# Patient Record
Sex: Female | Born: 1960 | Race: White | Hispanic: No | Smoking: Never smoker
Health system: Southern US, Community
[De-identification: ages and names within clinical notes are randomized; demographics above are authoritative.]

## PROBLEM LIST (undated history)

## (undated) DIAGNOSIS — C801 Malignant (primary) neoplasm, unspecified: Secondary | ICD-10-CM

## (undated) DIAGNOSIS — R251 Tremor, unspecified: Secondary | ICD-10-CM

## (undated) HISTORY — PX: TUBAL LIGATION: SHX77

## (undated) HISTORY — PX: GASTRECTOMY: SHX58

---

## 1997-12-29 ENCOUNTER — Emergency Department (HOSPITAL_COMMUNITY): Admission: EM | Admit: 1997-12-29 | Discharge: 1997-12-29 | Payer: Self-pay

## 1998-02-14 ENCOUNTER — Emergency Department (HOSPITAL_COMMUNITY): Admission: EM | Admit: 1998-02-14 | Discharge: 1998-02-14 | Payer: Self-pay | Admitting: *Deleted

## 2016-01-30 ENCOUNTER — Telehealth: Payer: Self-pay | Admitting: Hematology

## 2016-01-30 ENCOUNTER — Encounter: Payer: Self-pay | Admitting: Hematology

## 2016-01-30 NOTE — Progress Notes (Signed)
Called uninsured new patient to introduce myself as Estate manager/land agent and to discuss uninsured discount and medicaid application as well as [place] FAA. Left my return name and number.

## 2016-01-30 NOTE — Telephone Encounter (Signed)
Pt let me know that she will be self-pay. I will forward this information to our financial advocates.

## 2016-01-30 NOTE — Telephone Encounter (Signed)
Tc to the pt's daughter to reschedule appt for her mother. Unable to make 9/6 appt. Rescheduled to 9/8 with Dr. Burr Medico. Daughter voiced understanding. Location and address given. Demographics verified.

## 2016-01-30 NOTE — Progress Notes (Signed)
Patient returned my call. Introduced myself as Estate manager/land agent. Advised patient for being uninsured she would automatically receive a 55% discount and that she may apply for Medicaid. Also advised she may apply for the [facility] FAA for possibly an additional discount after she applies for Medicaid. Patient states she had Medicaid in [facility] and was told she would receive for [place] once she applies. Asked patient if she would like for me to mail the application to her [place] address. Patient states that would be great and that she could complete it and turn in next week when she comes to [facility]. Verified address on file and placed in outer office mail along with my card with my contact name and number. Patient verbalized understanding.

## 2016-02-04 ENCOUNTER — Telehealth: Payer: Self-pay | Admitting: Nurse Practitioner

## 2016-02-04 NOTE — Telephone Encounter (Signed)
Notified the patient's daughter about the change of appt. Appt has been moved to 9/8 at 1015 per Dr. Burr Medico for the pt to see Ned Card. Pt's daughter voiced understanding.

## 2016-02-05 ENCOUNTER — Ambulatory Visit: Payer: Self-pay | Admitting: Nurse Practitioner

## 2016-02-05 ENCOUNTER — Inpatient Hospital Stay (HOSPITAL_COMMUNITY)
Admission: EM | Admit: 2016-02-05 | Discharge: 2016-02-16 | DRG: 374 | Disposition: A | Discharge status: Hospice | Payer: Medicaid - Out of State | Attending: Internal Medicine | Admitting: Internal Medicine

## 2016-02-05 ENCOUNTER — Encounter (HOSPITAL_COMMUNITY): Payer: Self-pay | Admitting: Emergency Medicine

## 2016-02-05 ENCOUNTER — Emergency Department (HOSPITAL_COMMUNITY)
Admission: EM | Admit: 2016-02-05 | Discharge: 2016-02-05 | Disposition: A | Discharge status: Other Acute Inpatient Hospital | Payer: Self-pay

## 2016-02-05 DIAGNOSIS — K5900 Constipation, unspecified: Secondary | ICD-10-CM

## 2016-02-05 DIAGNOSIS — C169 Malignant neoplasm of stomach, unspecified: Secondary | ICD-10-CM | POA: Diagnosis present

## 2016-02-05 DIAGNOSIS — Z66 Do not resuscitate: Secondary | ICD-10-CM

## 2016-02-05 DIAGNOSIS — R188 Other ascites: Secondary | ICD-10-CM

## 2016-02-05 DIAGNOSIS — G893 Neoplasm related pain (acute) (chronic): Secondary | ICD-10-CM

## 2016-02-05 DIAGNOSIS — Z8249 Family history of ischemic heart disease and other diseases of the circulatory system: Secondary | ICD-10-CM

## 2016-02-05 DIAGNOSIS — D630 Anemia in neoplastic disease: Secondary | ICD-10-CM | POA: Diagnosis present

## 2016-02-05 DIAGNOSIS — R4586 Emotional lability: Secondary | ICD-10-CM | POA: Diagnosis not present

## 2016-02-05 DIAGNOSIS — Z9221 Personal history of antineoplastic chemotherapy: Secondary | ICD-10-CM

## 2016-02-05 DIAGNOSIS — Z515 Encounter for palliative care: Secondary | ICD-10-CM

## 2016-02-05 DIAGNOSIS — E43 Unspecified severe protein-calorie malnutrition: Secondary | ICD-10-CM | POA: Diagnosis present

## 2016-02-05 DIAGNOSIS — C786 Secondary malignant neoplasm of retroperitoneum and peritoneum: Principal | ICD-10-CM | POA: Diagnosis present

## 2016-02-05 DIAGNOSIS — R109 Unspecified abdominal pain: Secondary | ICD-10-CM | POA: Diagnosis present

## 2016-02-05 DIAGNOSIS — R64 Cachexia: Secondary | ICD-10-CM | POA: Diagnosis present

## 2016-02-05 DIAGNOSIS — Z881 Allergy status to other antibiotic agents status: Secondary | ICD-10-CM

## 2016-02-05 DIAGNOSIS — Z681 Body mass index (BMI) 19 or less, adult: Secondary | ICD-10-CM

## 2016-02-05 DIAGNOSIS — N390 Urinary tract infection, site not specified: Secondary | ICD-10-CM | POA: Diagnosis present

## 2016-02-05 DIAGNOSIS — Z833 Family history of diabetes mellitus: Secondary | ICD-10-CM

## 2016-02-05 DIAGNOSIS — Z801 Family history of malignant neoplasm of trachea, bronchus and lung: Secondary | ICD-10-CM

## 2016-02-05 DIAGNOSIS — Z7189 Other specified counseling: Secondary | ICD-10-CM

## 2016-02-05 DIAGNOSIS — K651 Peritoneal abscess: Secondary | ICD-10-CM | POA: Diagnosis present

## 2016-02-05 DIAGNOSIS — C799 Secondary malignant neoplasm of unspecified site: Secondary | ICD-10-CM

## 2016-02-05 DIAGNOSIS — R180 Malignant ascites: Secondary | ICD-10-CM

## 2016-02-05 DIAGNOSIS — D649 Anemia, unspecified: Secondary | ICD-10-CM | POA: Diagnosis present

## 2016-02-05 DIAGNOSIS — C162 Malignant neoplasm of body of stomach: Secondary | ICD-10-CM | POA: Diagnosis present

## 2016-02-05 DIAGNOSIS — Z903 Acquired absence of stomach [part of]: Secondary | ICD-10-CM

## 2016-02-05 DIAGNOSIS — E876 Hypokalemia: Secondary | ICD-10-CM | POA: Diagnosis not present

## 2016-02-05 DIAGNOSIS — C787 Secondary malignant neoplasm of liver and intrahepatic bile duct: Secondary | ICD-10-CM | POA: Diagnosis present

## 2016-02-05 DIAGNOSIS — R1114 Bilious vomiting: Secondary | ICD-10-CM

## 2016-02-05 DIAGNOSIS — Z79899 Other long term (current) drug therapy: Secondary | ICD-10-CM

## 2016-02-05 HISTORY — DX: Malignant (primary) neoplasm, unspecified: C80.1

## 2016-02-05 HISTORY — DX: Tremor, unspecified: R25.1

## 2016-02-05 NOTE — ED Notes (Signed)
MD at bedside. 

## 2016-02-05 NOTE — ED Triage Notes (Signed)
Pt family reports pt was discharged from [facility] in [facility] for abd pain that has been ongoing for the last 3 weeks. Pt is a stomach cancer pt not under any treatment at this time. Pt states pain in abd has been worse since having drain from stomach removed 3 weeks ago. Pt has been having vomiting for the last week and unable to have bowel movement.

## 2016-02-05 NOTE — ED Triage Notes (Signed)
Pt supposed to be at [facility] not here  Going there now

## 2016-02-05 NOTE — ED Provider Notes (Addendum)
[facility] DEPT Provider Note   CSN: [id number] Arrival date & time: 02/05/16  2342  By signing my name below, I, [name], attest that this documentation has been prepared under the direction and in the presence of [name], MD . Electronically Signed: [name], Scribe. 02/06/2016. 12:07 AM.  History   Chief Complaint Chief Complaint  Patient presents with  . Abdominal Pain   The history is provided by the patient and a relative.  Abdominal Pain   This is a chronic problem. The current episode started more than 1 week ago. The problem has been gradually worsening. The pain is located in the generalized abdominal region. Associated symptoms include constipation and arthralgias. Pertinent negatives include fever and dysuria. Nothing aggravates the symptoms. Nothing relieves the symptoms. Past workup includes CT scan. Her past medical history does not include ulcerative colitis or Crohn's disease.    HPI Comments: Selena Hayes is a 55 y.o. female who presents to the Emergency Department complaining of gradually worsening, constant, abdominal pain due to stage 4 stomach cancer that worsened last night. Pt reports she was diagnosed with stage 4 stomach cancer metastasizing to her pancreas in February of this year and has undergone 9 weeks of chemotherapy with no relief. Pt also reports partial gastrectomy on 12/17/15. Per daughter, pt currently lives in [place], [place] and was taken to [facility] last night for similar symptoms of abdominal pain. She notes the doctors at this facility wanted to admit her for "3 different bacterias in her stomach that were causing a possible infection" and wanted to obtain fluid to see if cancer cells were present. She states the only way she was allowed to leave that facility was if she promised to bring pt here when she arrived in [place]. Pt reports she has not had any bowel movements for over a month even after taking OTC  laxatives.   No past medical history on file.  There are no active problems to display for this patient.  No past surgical history on file.  OB History    No data available       Home Medications    Prior to Admission medications   Not on File    Family History No family history on file.  Social History Social History  Substance Use Topics  . Smoking status: Not on file  . Smokeless tobacco: Not on file  . Alcohol use Not on file   Allergies   Review of patient's allergies indicates not on file.   Review of Systems Review of Systems  Constitutional: Negative for fever.  Respiratory: Negative for shortness of breath.   Cardiovascular: Negative for chest pain.  Gastrointestinal: Positive for abdominal pain and constipation.  Genitourinary: Negative for dysuria.  Musculoskeletal: Positive for arthralgias.  All other systems reviewed and are negative.  Physical Exam Updated Vital Signs BP (!) 171/106 (BP Location: Right Arm)   Pulse 110   Temp 98.2 F (36.8 C) (Oral)   Resp (!) 27   Ht 5\' 2"  (1.575 m)   Wt 92 lb (41.7 kg)   SpO2 97%   BMI 16.83 kg/m   Physical Exam  Constitutional: She appears well-developed and well-nourished.  HENT:  Head: Normocephalic.  Mouth/Throat: Oropharynx is clear and moist. No oropharyngeal exudate.  Eyes: Conjunctivae and EOM are normal. Pupils are equal, round, and reactive to light. Right eye exhibits no discharge. Left eye exhibits no discharge. No scleral icterus.  Neck: Normal range of motion.  Neck supple. No JVD present. No tracheal deviation present.  Trachea is midline. No stridor or carotid bruits.  No cervical or supraclavicular lymphadenopathy   Cardiovascular: Normal rate, regular rhythm, normal heart sounds and intact distal pulses.   No murmur heard. Pulmonary/Chest: Effort normal and breath sounds normal. No stridor. No respiratory distress. She has no wheezes. She has no rales.  Lungs CTA bilaterally.    Abdominal: Soft. She exhibits distension. She exhibits no mass. There is no tenderness. There is no rebound and no guarding.  Midline incision clear dry and intact no bruising  Distended   Musculoskeletal: Normal range of motion. She exhibits no edema or tenderness.  Intact dorsalis pedis pulse   Lymphadenopathy:    She has no cervical adenopathy.  Neurological: She is alert. She has normal reflexes. She displays normal reflexes. She exhibits normal muscle tone.  Skin: Skin is warm and dry. Capillary refill takes less than 2 seconds.  Psychiatric: She has a normal mood and affect. Her behavior is normal.  Nursing note and vitals reviewed.  ED Treatments / Results  Labs (all labs ordered are listed, but only abnormal results are displayed) Labs Reviewed - No data to display  EKG  EKG Interpretation  Date/Time:  Wednesday February 05 2016 23:42:42 EDT Ventricular Rate:  116 PR Interval:    QRS Duration: 82 QT Interval:  317 QTC Calculation: 441 R Axis:   74 Text Interpretation:  Sinus tachycardia Baseline wander in lead(s) V4 Confirmed by [facility]  MD, Zyrus Hetland ([id number]) on 02/05/2016 11:45:32 PM       Radiology Dg Abd Acute W/chest  Result Date: 02/06/2016 CLINICAL DATA:  55 year old female with history of stage IV stomach cancer and partial gastrectomy presenting with severe mid and lower abdominal pain. EXAM: DG ABDOMEN ACUTE W/ 1V CHEST COMPARISON:  None. FINDINGS: There is minimal left lung base atelectatic changes. A 12 mm rounded radiopaque nodular density in the right lower lung field laterally in the seventh intercostal space likely corresponds to the nipple shadow. Repeat radiograph with nipple markers may provide better evaluation. The lungs are otherwise clear. There is no pleural effusion or pneumothorax. The cardiac silhouette is within normal limits. Right pectoral Port-A-Cath with tip at the cavoatrial junction. No acute osseous pathology. There is postsurgical  changes of the bowel with multiple surgical sutures in the left hemi abdomen. Large amount of dense stool noted throughout the colon. There is no bowel dilatation or evidence of obstruction. No free air or radiopaque calculi noted. The soft tissues and the osseous structures are grossly unremarkable. IMPRESSION: Constipation.  No bowel obstruction or free air. A Next 12 mm right lung base nodular density likely corresponds to the nipple shadow. Right-sided Port-A-Cath with tip at the cavoatrial junction. Electronically Signed   By: Anner Crete M.D.   On: 02/06/2016 01:15   Procedures Procedures  DIAGNOSTIC STUDIES:  Oxygen Saturation is 97% on RA, normal by my interpretation.    COORDINATION OF CARE:  11:58 PM Discussed treatment plan with pt at bedside and pt agreed to plan.  Medications Ordered in ED Medications - No data to display   EKG Interpretation  Date/Time:  Wednesday February 05 2016 23:42:42 EDT Ventricular Rate:  116 PR Interval:    QRS Duration: 82 QT Interval:  317 QTC Calculation: 441 R Axis:   74 Text Interpretation:  Sinus tachycardia Baseline wander in lead(s) V4 Confirmed by [facility]  MD, Nakai Yard ([id number]) on 02/05/2016 11:45:32 PM  Initial Impression / Assessment and Plan / ED Course  I have reviewed the triage vital signs and the nursing notes.  Pertinent labs & imaging results that were available during my care of the patient were reviewed by me and considered in my medical decision making (see chart for details). . Vitals:   02/06/16 0230 02/06/16 0300  BP: 129/83 131/91  Pulse: 99 102  Resp: (!) 28 17  Temp:     Results for orders placed or performed during the hospital encounter of 02/05/16  CBC with Differential/Platelet  Result Value Ref Range   WBC 9.1 4.0 - 10.5 K/uL   RBC 2.58 (L) 3.87 - 5.11 MIL/uL   Hemoglobin 7.7 (L) 12.0 - 15.0 g/dL   HCT 23.0 (L) 36.0 - 46.0 %   MCV 89.1 78.0 - 100.0 fL   MCH 29.8 26.0 - 34.0 pg   MCHC  33.5 30.0 - 36.0 g/dL   RDW 14.0 11.5 - 15.5 %   Platelets 416 (H) 150 - 400 K/uL   Neutrophils Relative % 73 %   Neutro Abs 6.7 1.7 - 7.7 K/uL   Lymphocytes Relative 15 %   Lymphs Abs 1.4 0.7 - 4.0 K/uL   Monocytes Relative 11 %   Monocytes Absolute 1.0 0.1 - 1.0 K/uL   Eosinophils Relative 1 %   Eosinophils Absolute 0.1 0.0 - 0.7 K/uL   Basophils Relative 0 %   Basophils Absolute 0.0 0.0 - 0.1 K/uL  Comprehensive metabolic panel  Result Value Ref Range   Sodium 133 (L) 135 - 145 mmol/L   Potassium 3.5 3.5 - 5.1 mmol/L   Chloride 97 (L) 101 - 111 mmol/L   CO2 25 22 - 32 mmol/L   Glucose, Bld 105 (H) 65 - 99 mg/dL   BUN 12 6 - 20 mg/dL   Creatinine, Ser 0.58 0.44 - 1.00 mg/dL   Calcium 8.8 (L) 8.9 - 10.3 mg/dL   Total Protein 6.6 6.5 - 8.1 g/dL   Albumin 2.7 (L) 3.5 - 5.0 g/dL   AST 15 15 - 41 U/L   ALT 17 14 - 54 U/L   Alkaline Phosphatase 75 38 - 126 U/L   Total Bilirubin 1.0 0.3 - 1.2 mg/dL   GFR calc non Af Amer >60 >60 mL/min   GFR calc Af Amer >60 >60 mL/min   Anion gap 11 5 - 15  Urinalysis, Routine w reflex microscopic (not at [facility])  Result Value Ref Range   Color, Urine YELLOW YELLOW   APPearance CLOUDY (A) CLEAR   Specific Gravity, Urine 1.011 1.005 - 1.030   pH 6.0 5.0 - 8.0   Glucose, UA NEGATIVE NEGATIVE mg/dL   Hgb urine dipstick TRACE (A) NEGATIVE   Bilirubin Urine NEGATIVE NEGATIVE   Ketones, ur 40 (A) NEGATIVE mg/dL   Protein, ur NEGATIVE NEGATIVE mg/dL   Nitrite NEGATIVE NEGATIVE   Leukocytes, UA LARGE (A) NEGATIVE  Lipase, blood  Result Value Ref Range   Lipase 15 11 - 51 U/L  Urine microscopic-add on  Result Value Ref Range   Squamous Epithelial / LPF 6-30 (A) NONE SEEN   WBC, UA TOO NUMEROUS TO COUNT 0 - 5 WBC/hpf   RBC / HPF 0-5 0 - 5 RBC/hpf   Bacteria, UA FEW (A) NONE SEEN   Trichomonas, UA PRESENT   I-Stat Chem 8, ED  Result Value Ref Range   Sodium 132 (L) 135 - 145 mmol/L   Potassium 3.5 3.5 - 5.1 mmol/L  Chloride 95 (L) 101 - 111  mmol/L   BUN 10 6 - 20 mg/dL   Creatinine, Ser 0.50 0.44 - 1.00 mg/dL   Glucose, Bld 101 (H) 65 - 99 mg/dL   Calcium, Ion 1.15 1.15 - 1.40 mmol/L   TCO2 24 0 - 100 mmol/L   Hemoglobin 11.9 (L) 12.0 - 15.0 g/dL   HCT 35.0 (L) 36.0 - 46.0 %  I-stat troponin, ED  Result Value Ref Range   Troponin i, poc 0.00 0.00 - 0.08 ng/mL   Comment 3          POC occult blood, ED RN will collect  Result Value Ref Range   Fecal Occult Bld NEGATIVE NEGATIVE  Type and screen [place]  Result Value Ref Range   ABO/RH(D) O POS    Antibody Screen NEG    Sample Expiration 02/09/2016    Dg Abd Acute W/chest  Result Date: 02/06/2016 CLINICAL DATA:  55 year old female with history of stage IV stomach cancer and partial gastrectomy presenting with severe mid and lower abdominal pain. EXAM: DG ABDOMEN ACUTE W/ 1V CHEST COMPARISON:  None. FINDINGS: There is minimal left lung base atelectatic changes. A 12 mm rounded radiopaque nodular density in the right lower lung field laterally in the seventh intercostal space likely corresponds to the nipple shadow. Repeat radiograph with nipple markers may provide better evaluation. The lungs are otherwise clear. There is no pleural effusion or pneumothorax. The cardiac silhouette is within normal limits. Right pectoral Port-A-Cath with tip at the cavoatrial junction. No acute osseous pathology. There is postsurgical changes of the bowel with multiple surgical sutures in the left hemi abdomen. Large amount of dense stool noted throughout the colon. There is no bowel dilatation or evidence of obstruction. No free air or radiopaque calculi noted. The soft tissues and the osseous structures are grossly unremarkable. IMPRESSION: Constipation.  No bowel obstruction or free air. A Next 12 mm right lung base nodular density likely corresponds to the nipple shadow. Right-sided Port-A-Cath with tip at the cavoatrial junction. Electronically Signed   By: Anner Crete  M.D.   On: 02/06/2016 01:15   Medications  HYDROmorphone (DILAUDID) injection 1 mg (not administered)  HYDROmorphone (DILAUDID) injection 1 mg (1 mg Intravenous Given 02/06/16 0034)  ondansetron (ZOFRAN) injection 4 mg (4 mg Intravenous Given 02/06/16 0034)  sodium chloride 0.9 % bolus 500 mL (0 mLs Intravenous Stopped 02/06/16 0130)  HYDROmorphone (DILAUDID) injection 1 mg (1 mg Intravenous Given 02/06/16 0324)     I personally performed the services described in this documentation, which was scribed in my presence. The recorded information has been reviewed and is accurate.   Final Clinical Impressions(s) / ED Diagnoses   Final diagnoses:  None    New Prescriptions New Prescriptions   No medications on file     Marquelle Musgrave, MD 02/06/16 0359    Kimblery Diop, MD 02/06/16 [phone]

## 2016-02-05 NOTE — ED Notes (Signed)
Bed: RESB Expected date:  Expected time:  Means of arrival:  Comments: abd cancer from car

## 2016-02-06 ENCOUNTER — Emergency Department (HOSPITAL_COMMUNITY): Payer: Medicaid - Out of State

## 2016-02-06 ENCOUNTER — Inpatient Hospital Stay (HOSPITAL_COMMUNITY): Payer: Medicaid - Out of State

## 2016-02-06 ENCOUNTER — Encounter (HOSPITAL_COMMUNITY): Payer: Self-pay | Admitting: Emergency Medicine

## 2016-02-06 DIAGNOSIS — R630 Anorexia: Secondary | ICD-10-CM

## 2016-02-06 DIAGNOSIS — Z833 Family history of diabetes mellitus: Secondary | ICD-10-CM | POA: Diagnosis not present

## 2016-02-06 DIAGNOSIS — Z8249 Family history of ischemic heart disease and other diseases of the circulatory system: Secondary | ICD-10-CM | POA: Diagnosis not present

## 2016-02-06 DIAGNOSIS — Z681 Body mass index (BMI) 19 or less, adult: Secondary | ICD-10-CM | POA: Diagnosis not present

## 2016-02-06 DIAGNOSIS — R180 Malignant ascites: Secondary | ICD-10-CM | POA: Diagnosis present

## 2016-02-06 DIAGNOSIS — Z801 Family history of malignant neoplasm of trachea, bronchus and lung: Secondary | ICD-10-CM | POA: Diagnosis not present

## 2016-02-06 DIAGNOSIS — Z515 Encounter for palliative care: Secondary | ICD-10-CM | POA: Diagnosis not present

## 2016-02-06 DIAGNOSIS — R4586 Emotional lability: Secondary | ICD-10-CM | POA: Diagnosis not present

## 2016-02-06 DIAGNOSIS — E43 Unspecified severe protein-calorie malnutrition: Secondary | ICD-10-CM | POA: Diagnosis present

## 2016-02-06 DIAGNOSIS — C162 Malignant neoplasm of body of stomach: Secondary | ICD-10-CM | POA: Diagnosis present

## 2016-02-06 DIAGNOSIS — R64 Cachexia: Secondary | ICD-10-CM | POA: Diagnosis present

## 2016-02-06 DIAGNOSIS — R634 Abnormal weight loss: Secondary | ICD-10-CM

## 2016-02-06 DIAGNOSIS — R1084 Generalized abdominal pain: Secondary | ICD-10-CM | POA: Diagnosis present

## 2016-02-06 DIAGNOSIS — R109 Unspecified abdominal pain: Secondary | ICD-10-CM

## 2016-02-06 DIAGNOSIS — D630 Anemia in neoplastic disease: Secondary | ICD-10-CM

## 2016-02-06 DIAGNOSIS — D649 Anemia, unspecified: Secondary | ICD-10-CM | POA: Diagnosis present

## 2016-02-06 DIAGNOSIS — Z9221 Personal history of antineoplastic chemotherapy: Secondary | ICD-10-CM | POA: Diagnosis not present

## 2016-02-06 DIAGNOSIS — N390 Urinary tract infection, site not specified: Secondary | ICD-10-CM | POA: Diagnosis present

## 2016-02-06 DIAGNOSIS — Z881 Allergy status to other antibiotic agents status: Secondary | ICD-10-CM | POA: Diagnosis not present

## 2016-02-06 DIAGNOSIS — C786 Secondary malignant neoplasm of retroperitoneum and peritoneum: Principal | ICD-10-CM

## 2016-02-06 DIAGNOSIS — K651 Peritoneal abscess: Secondary | ICD-10-CM | POA: Diagnosis present

## 2016-02-06 DIAGNOSIS — R110 Nausea: Secondary | ICD-10-CM

## 2016-02-06 DIAGNOSIS — R188 Other ascites: Secondary | ICD-10-CM

## 2016-02-06 DIAGNOSIS — E876 Hypokalemia: Secondary | ICD-10-CM | POA: Diagnosis not present

## 2016-02-06 DIAGNOSIS — G893 Neoplasm related pain (acute) (chronic): Secondary | ICD-10-CM

## 2016-02-06 DIAGNOSIS — Z903 Acquired absence of stomach [part of]: Secondary | ICD-10-CM | POA: Diagnosis not present

## 2016-02-06 DIAGNOSIS — K5900 Constipation, unspecified: Secondary | ICD-10-CM

## 2016-02-06 DIAGNOSIS — Z66 Do not resuscitate: Secondary | ICD-10-CM | POA: Diagnosis not present

## 2016-02-06 DIAGNOSIS — C787 Secondary malignant neoplasm of liver and intrahepatic bile duct: Secondary | ICD-10-CM | POA: Diagnosis present

## 2016-02-06 DIAGNOSIS — C169 Malignant neoplasm of stomach, unspecified: Secondary | ICD-10-CM

## 2016-02-06 DIAGNOSIS — Z79899 Other long term (current) drug therapy: Secondary | ICD-10-CM | POA: Diagnosis not present

## 2016-02-06 LAB — URINE MICROSCOPIC-ADD ON

## 2016-02-06 LAB — I-STAT CHEM 8, ED
BUN: 10 mg/dL (ref 6–20)
CHLORIDE: 95 mmol/L — AB (ref 101–111)
CREATININE: 0.5 mg/dL (ref 0.44–1.00)
Calcium, Ion: 1.15 mmol/L (ref 1.15–1.40)
GLUCOSE: 101 mg/dL — AB (ref 65–99)
HEMATOCRIT: 35 % — AB (ref 36.0–46.0)
HEMOGLOBIN: 11.9 g/dL — AB (ref 12.0–15.0)
POTASSIUM: 3.5 mmol/L (ref 3.5–5.1)
Sodium: 132 mmol/L — ABNORMAL LOW (ref 135–145)
TCO2: 24 mmol/L (ref 0–100)

## 2016-02-06 LAB — GLUCOSE, CAPILLARY
GLUCOSE-CAPILLARY: 106 mg/dL — AB (ref 65–99)
GLUCOSE-CAPILLARY: 115 mg/dL — AB (ref 65–99)

## 2016-02-06 LAB — TYPE AND SCREEN
ABO/RH(D): O POS
ANTIBODY SCREEN: NEGATIVE

## 2016-02-06 LAB — I-STAT TROPONIN, ED: Troponin i, poc: 0 ng/mL (ref 0.00–0.08)

## 2016-02-06 LAB — COMPREHENSIVE METABOLIC PANEL
ALBUMIN: 2.7 g/dL — AB (ref 3.5–5.0)
ALT: 17 U/L (ref 14–54)
ANION GAP: 11 (ref 5–15)
AST: 15 U/L (ref 15–41)
Alkaline Phosphatase: 75 U/L (ref 38–126)
BUN: 12 mg/dL (ref 6–20)
CHLORIDE: 97 mmol/L — AB (ref 101–111)
CO2: 25 mmol/L (ref 22–32)
Calcium: 8.8 mg/dL — ABNORMAL LOW (ref 8.9–10.3)
Creatinine, Ser: 0.58 mg/dL (ref 0.44–1.00)
GFR calc non Af Amer: >60 mL/min (ref >60)
GLUCOSE: 105 mg/dL — AB (ref 65–99)
POTASSIUM: 3.5 mmol/L (ref 3.5–5.1)
SODIUM: 133 mmol/L — AB (ref 135–145)
Total Bilirubin: 1 mg/dL (ref 0.3–1.2)
Total Protein: 6.6 g/dL (ref 6.5–8.1)

## 2016-02-06 LAB — CBC WITH DIFFERENTIAL/PLATELET
BASOS PCT: 0 %
Basophils Absolute: 0 10*3/uL (ref 0.0–0.1)
EOS ABS: 0.1 10*3/uL (ref 0.0–0.7)
EOS PCT: 1 %
HCT: 23 % — ABNORMAL LOW (ref 36.0–46.0)
HEMOGLOBIN: 7.7 g/dL — AB (ref 12.0–15.0)
LYMPHS ABS: 1.4 10*3/uL (ref 0.7–4.0)
Lymphocytes Relative: 15 %
MCH: 29.8 pg (ref 26.0–34.0)
MCHC: 33.5 g/dL (ref 30.0–36.0)
MCV: 89.1 fL (ref 78.0–100.0)
MONO ABS: 1 10*3/uL (ref 0.1–1.0)
MONOS PCT: 11 %
NEUTROS PCT: 73 %
Neutro Abs: 6.7 10*3/uL (ref 1.7–7.7)
PLATELETS: 416 10*3/uL — AB (ref 150–400)
RBC: 2.58 MIL/uL — ABNORMAL LOW (ref 3.87–5.11)
RDW: 14 % (ref 11.5–15.5)
WBC: 9.1 10*3/uL (ref 4.0–10.5)

## 2016-02-06 LAB — URINALYSIS, ROUTINE W REFLEX MICROSCOPIC
Bilirubin Urine: NEGATIVE
GLUCOSE, UA: NEGATIVE mg/dL
Ketones, ur: 40 mg/dL — AB
Nitrite: NEGATIVE
PROTEIN: NEGATIVE mg/dL
SPECIFIC GRAVITY, URINE: 1.011 (ref 1.005–1.030)
pH: 6 (ref 5.0–8.0)

## 2016-02-06 LAB — ABO/RH: ABO/RH(D): O POS

## 2016-02-06 LAB — LIPASE, BLOOD: LIPASE: 15 U/L (ref 11–51)

## 2016-02-06 LAB — POC OCCULT BLOOD, ED: Fecal Occult Bld: NEGATIVE

## 2016-02-06 MED ORDER — BISACODYL 10 MG RE SUPP: 10.0000 mg | Freq: Every day | RECTAL | Status: DC | PRN

## 2016-02-06 MED ORDER — METHYLNALTREXONE BROMIDE 12 MG/0.6ML ~~LOC~~ SOLN
8.0000 mg | Freq: Once | SUBCUTANEOUS | Status: AC
  Administered 2016-02-06: 8 mg via SUBCUTANEOUS
  Filled 2016-02-06: qty 0.6

## 2016-02-06 MED ORDER — FLEET ENEMA 7-19 GM/118ML RE ENEM
1.0000 | ENEMA | Freq: Once | RECTAL | Status: AC
  Administered 2016-02-06: 1 via RECTAL
  Filled 2016-02-06: qty 1

## 2016-02-06 MED ORDER — SODIUM CHLORIDE 0.9 % IV BOLUS (SEPSIS)
500.0000 mL | Freq: Once | INTRAVENOUS | Status: AC
  Administered 2016-02-06: 500 mL via INTRAVENOUS

## 2016-02-06 MED ORDER — FENTANYL CITRATE (PF) 100 MCG/2ML IJ SOLN
12.5000 ug | INTRAMUSCULAR | Status: DC | PRN
  Administered 2016-02-06: 12.5 ug via INTRAVENOUS
  Filled 2016-02-06: qty 2

## 2016-02-06 MED ORDER — IOPAMIDOL (ISOVUE-300) INJECTION 61%
100.0000 mL | Freq: Once | INTRAVENOUS | Status: AC | PRN
  Administered 2016-02-06: 80 mL via INTRAVENOUS

## 2016-02-06 MED ORDER — ONDANSETRON HCL 4 MG PO TABS: 4.0000 mg | ORAL_TABLET | Freq: Four times a day (QID) | ORAL | Status: DC | PRN

## 2016-02-06 MED ORDER — HYDROMORPHONE HCL 1 MG/ML IJ SOLN
1.0000 mg | Freq: Once | INTRAMUSCULAR | Status: AC
  Administered 2016-02-06: 1 mg via INTRAVENOUS
  Filled 2016-02-06: qty 1

## 2016-02-06 MED ORDER — DEXTROSE-NACL 5-0.9 % IV SOLN
INTRAVENOUS | Status: AC
  Administered 2016-02-06 (×2): via INTRAVENOUS

## 2016-02-06 MED ORDER — IOPAMIDOL (ISOVUE-300) INJECTION 61%: 30.0000 mL | Freq: Once | INTRAVENOUS | Status: DC | PRN

## 2016-02-06 MED ORDER — HYDROMORPHONE HCL 1 MG/ML IJ SOLN
0.5000 mg | Freq: Once | INTRAMUSCULAR | Status: AC
  Administered 2016-02-06: 0.5 mg via INTRAVENOUS
  Filled 2016-02-06: qty 1

## 2016-02-06 MED ORDER — ONDANSETRON HCL 4 MG/2ML IJ SOLN
4.0000 mg | Freq: Four times a day (QID) | INTRAMUSCULAR | Status: DC | PRN
  Administered 2016-02-06 – 2016-02-07 (×5): 4 mg via INTRAVENOUS
  Filled 2016-02-06 (×5): qty 2

## 2016-02-06 MED ORDER — ACETAMINOPHEN 325 MG PO TABS: 650.0000 mg | ORAL_TABLET | Freq: Four times a day (QID) | ORAL | Status: DC | PRN

## 2016-02-06 MED ORDER — SODIUM CHLORIDE 0.9% FLUSH: 10.0000 mL | INTRAVENOUS | Status: DC | PRN

## 2016-02-06 MED ORDER — ACETAMINOPHEN 650 MG RE SUPP: 650.0000 mg | Freq: Four times a day (QID) | RECTAL | Status: DC | PRN

## 2016-02-06 MED ORDER — ONDANSETRON HCL 4 MG/2ML IJ SOLN
4.0000 mg | Freq: Once | INTRAMUSCULAR | Status: AC
  Administered 2016-02-06: 4 mg via INTRAVENOUS
  Filled 2016-02-06: qty 2

## 2016-02-06 MED ORDER — HYDROMORPHONE HCL 2 MG/ML IJ SOLN
2.0000 mg | INTRAMUSCULAR | Status: DC | PRN
  Administered 2016-02-06 – 2016-02-11 (×46): 2 mg via INTRAVENOUS
  Filled 2016-02-06 (×31): qty 1

## 2016-02-06 MED ORDER — NICOTINE 14 MG/24HR TD PT24
14.0000 mg | MEDICATED_PATCH | Freq: Every day | TRANSDERMAL | Status: DC
  Administered 2016-02-08 – 2016-02-11 (×4): 14 mg via TRANSDERMAL
  Filled 2016-02-06 (×5): qty 1

## 2016-02-06 MED ORDER — HYDROMORPHONE HCL 1 MG/ML IJ SOLN
1.0000 mg | INTRAMUSCULAR | Status: DC | PRN
  Administered 2016-02-06 (×5): 1 mg via INTRAVENOUS
  Filled 2016-02-06 (×5): qty 1

## 2016-02-06 MED ORDER — HYDROMORPHONE HCL 1 MG/ML IJ SOLN
0.5000 mg | INTRAMUSCULAR | Status: DC | PRN
  Administered 2016-02-06: 0.5 mg via INTRAVENOUS
  Filled 2016-02-06: qty 1

## 2016-02-06 MED ORDER — LEVOFLOXACIN IN D5W 500 MG/100ML IV SOLN
500.0000 mg | INTRAVENOUS | Status: DC
  Administered 2016-02-06 – 2016-02-08 (×3): 500 mg via INTRAVENOUS
  Filled 2016-02-06 (×3): qty 100

## 2016-02-06 NOTE — Progress Notes (Signed)
Patient shaking uncontrollably after voiding in extreme pain in her abdomen, notified MD that pain was not controlled with 1 mg of Dilaudid q2 hours.  New orders received.  Urine culture added.

## 2016-02-06 NOTE — Progress Notes (Signed)
Bladder scan performed approximately 30 minutes after patient voided, approximately 120 ccs of urine detected by scanner.

## 2016-02-06 NOTE — Progress Notes (Signed)
Pharmacy Antibiotic Note  Selena Hayes is a 55 y.o. female admitted on 02/05/2016 with UTI.  Pharmacy has been consulted for levaquin dosing.  Plan: Levaquin 500mg  IV q24h  Height: 5\' 2"  (157.5 cm) Weight: 92 lb (41.7 kg) IBW/kg (Calculated) : 50.1  Temp (24hrs), Avg:98.4 F (36.9 C), Min:98.2 F (36.8 C), Max:98.5 F (36.9 C)   Recent Labs Lab 02/06/16 0006 02/06/16 0027  WBC 9.1  --   CREATININE 0.58 0.50    Estimated Creatinine Clearance: 51.7 mL/min (by C-G formula based on SCr of 0.8 mg/dL).    Allergies  Allergen Reactions  . Keflex [Cephalexin] Hives    Antimicrobials this admission: 9/7 levaquin >>    >>   Dose adjustments this admission:   Microbiology results:  BCx:   UCx:    Sputum:    MRSA PCR:   Thank you for allowing pharmacy to be a part of this patient's care.  Dorrene German 02/06/2016 4:26 AM

## 2016-02-06 NOTE — ED Notes (Signed)
Pt reporting increase in pain. 10/10

## 2016-02-06 NOTE — Progress Notes (Signed)
Patient with severe pain and nausea after taking 1-2 sips of Sprite.  MD notified, order changed back to NPO except for ice chips.

## 2016-02-06 NOTE — ED Notes (Signed)
Verbal order for Dilaudid 1mg  IVP per Dr.Polumbo

## 2016-02-06 NOTE — Progress Notes (Signed)
Patient admitted after midnight, please see H&P.  Dr. Burr Medico to see patient.  CT scan done.  Relistor ordered for constipation.  Patient requesting liquids.  Eulogio Bear DO

## 2016-02-06 NOTE — Consult Note (Addendum)
[place]  [phone]) [phone]   [place]  DOB: 1960/09/15  MR#: [id number]  CSN#: [id number]    Requesting Physician: [facility]  Patient Care Team: No Pcp Per Patient as PCP - General (General Practice)  Reason for consult: metastatic gastric cancer   Oncology History   Gastric adenocarcinoma [facility])   Staging form: Gastric Stromal Tumor - Gastric Gist, AJCC 7th Edition   - Clinical stage from 09/05/2015: T3, N0, M0 - Signed by Truitt Merle, MD on 02/06/2016   - Pathologic stage from 12/17/2015: Stage IV (T3, N1, M1) - Signed by Truitt Merle, MD on 02/06/2016      Gastric adenocarcinoma ([place])   08/20/2015 Initial Diagnosis    Gastric adenocarcinoma ([place])      08/20/2015 Initial Biopsy    Gastric ulcer biopsy showed invasive poorly differentiated adeno carcinoma      09/04/2015 Imaging    Staging PET CT scan showed mural thickening involving the gastric antrum, no significant hypermetabolic uptake. No evidence of lymph node or distant metastasis.      09/25/2015 -  Chemotherapy    Neoadjuvant chemotherapy with ECF (5-FU, epirubicin, cisplatin every 21 days for 3 cycles        12/17/2015 Surgery    Subtotal gastrectomy with Roux-en-Y gastro-jejunostomy, by Dr. Lavetta Nielsen      12/17/2015 Pathology Results    Partial gastrectomy showed adenocarcinoma in the gastric antrum, greater curvature, diffuse type predominantly by signet ring carcinoma, poorly differentiated, tumor invades the seroma, 10 of 11 nodes were positive for carcinoma, all margins were negative. Biopsy of the omental mass was positive for metastatic carcinoma.      12/17/2015 Receptors her2    HER2 negative by IHC        History of present illness:   Selena Hayes is a 55 y.o. 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 She was recently referred to our cancer center due to her relocation, and she was scheduled to see me tomorrow in the clinic.  She initially presented with symptoms of indigestion in February 2017. She underwent EGD, which showed a ulcer in the stomach body. Initial biopsy was negative for malignancy. She underwent a second EGD on 08/20/2015, and repeated biopsy of the gastric ulcer showed poor differentiated invasive adenocarcinoma. It was staged as T3 N0 by EUS, and was negative for distant metastasis on staging PET. She was seen at [facility] oncology specialists Dr. Clyda Greener and received 3 cycles of neoadjuvant chemotherapy ECF, tolerated poorly but completed. She subsequently underwent subtotal gastrostomy on 12/17/2015. Unfortunately she was found to have omental nodule, and a biopsy showed metastatic cancer. Her surgical path stage was T3N3M1.   Her postoperative course was complicated by abdominal abscess, required drainage tube and IV antibiotics. The drainage tube was finally removed. She has been having abdominal pain since the surgery, it has getting worse daily. She also developed worsening fatigue, anorexia, constipation, and intermittent nausea and vomiting. She states she has not had a bowel movement for the past 1 month, has not eaten much food for the past few weeks. She has lost about 25 pounds in the past few months. She is very weak, is only able to walk for very short distance, and spends most of time in chairs  or bad. Her abdominal pain has getting much worse lately, she decided to relocate to [facility], where she is origin from, to be closer to her family members.  MEDICAL HISTORY:  Past Medical History:  Diagnosis Date  . Cancer (HCC)    Stomach, Skin  . Tremors of nervous system     SURGICAL HISTORY: Past Surgical History:  Procedure Laterality Date  . GASTRECTOMY    . TUBAL LIGATION      SOCIAL  HISTORY: Social History   Social History  . Marital status: Divorced    Spouse name: N/A  . Number of children: N/A  . Years of education: N/A   Occupational History  . Not on file.   Social History Main Topics  . Smoking status: Never Smoker  . Smokeless tobacco: Never Used  . Alcohol use No  . Drug use: No  . Sexual activity: Not on file   Other Topics Concern  . Not on file   Social History Narrative  . No narrative on file    FAMILY HISTORY: Family History  Problem Relation Age of Onset  . CAD Mother   . Lung cancer Father   . Diabetes Brother     ALLERGIES:  is allergic to keflex [cephalexin].  MEDICATIONS:  Current Facility-Administered Medications  Medication Dose Route Frequency Provider Last Rate Last Dose  . acetaminophen (TYLENOL) tablet 650 mg  650 mg Oral Q6H PRN Rise Patience, MD       Or  . acetaminophen (TYLENOL) suppository 650 mg  650 mg Rectal Q6H PRN Rise Patience, MD      . bisacodyl (DULCOLAX) suppository 10 mg  10 mg Rectal Daily PRN Geradine Girt, DO      . dextrose 5 %-0.9 % sodium chloride infusion   Intravenous Continuous Rise Patience, MD 75 mL/hr at 02/06/16 2057    . HYDROmorphone (DILAUDID) injection 2 mg  2 mg Intravenous Q2H PRN Geradine Girt, DO   2 mg at 02/06/16 2103  . iopamidol (ISOVUE-300) 61 % injection 30 mL  30 mL Oral Once PRN Rise Patience, MD      . levofloxacin [facility]) IVPB 500 mg  500 mg Intravenous Q24H Dorrene German, [facility]   Stopped at 02/06/16 0533  . nicotine (NICODERM CQ - dosed in mg/24 hours) patch 14 mg  14 mg Transdermal Daily Rise Patience, MD      . ondansetron [facility]) tablet 4 mg  4 mg Oral Q6H PRN Rise Patience, MD       Or  . ondansetron [facility]) injection 4 mg  4 mg Intravenous Q6H PRN Rise Patience, MD   4 mg at 02/06/16 2108  . sodium chloride flush (NS) 0.9 % injection 10-40 mL  10-40 mL Intracatheter PRN Geradine Girt, DO        REVIEW OF SYSTEMS:    Constitutional: Denies fevers, chills or abnormal night sweats Eyes: Denies blurriness of vision, double vision or watery eyes Ears, nose, mouth, throat, and face: Denies mucositis or sore throat Respiratory: Denies cough, dyspnea or wheezes Cardiovascular: Denies palpitation, chest discomfort or lower extremity swelling Gastrointestinal:  Denies nausea, heartburn or change in bowel habits Skin: Denies abnormal skin rashes Lymphatics: Denies new lymphadenopathy or easy bruising Neurological:Denies numbness, tingling or new weaknesses Behavioral/Psych: Mood is stable, no new changes  All other systems were reviewed with the patient and are negative.  PHYSICAL EXAMINATION: ECOG PERFORMANCE STATUS: 4 - Bedbound  Vitals:  02/06/16 0628 02/06/16 1400  BP: 131/93 127/88  Pulse: 101 97  Resp: 21 16  Temp: 98.3 F (36.8 C) 98.1 F (36.7 C)   Filed Weights   02/05/16 2344 02/06/16 0628  Weight: 92 lb (41.7 kg) 105 lb 2.6 oz (47.7 kg)    GENERAL:alert, Cachectic SKIN: skin color, texture, turgor are normal, no rashes or significant lesions EYES: normal, conjunctiva are pink and non-injected, sclera clear OROPHARYNX:no exudate, no erythema and lips, buccal mucosa, and tongue normal  NECK: supple, thyroid normal size, non-tender, without nodularity LYMPH:  no palpable lymphadenopathy in the cervical, axillary or inguinal LUNGS: clear to auscultation and percussion with normal breathing effort HEART: regular rate & rhythm and no murmurs and no lower extremity edema ABDOMEN:abdomen distended, diffuse tenderness even on gentle palpation Musculoskeletal:no cyanosis of digits and no clubbing  PSYCH: alert & oriented x 3 with fluent speech NEURO: no focal motor/sensory deficits  LABORATORY DATA:  I have reviewed the data as listed Lab Results  Component Value Date   WBC 9.1 02/06/2016   HGB 11.9 (L) 02/06/2016   HCT 35.0 (L) 02/06/2016   MCV 89.1 02/06/2016   PLT 416 (H) 02/06/2016     Recent Labs  02/06/16 0006 02/06/16 0027  NA 133* 132*  K 3.5 3.5  CL 97* 95*  CO2 25  --   GLUCOSE 105* 101*  BUN 12 10  CREATININE 0.58 0.50  CALCIUM 8.8*  --   GFRNONAA >60  --   GFRAA >60  --   PROT 6.6  --   ALBUMIN 2.7*  --   AST 15  --   ALT 17  --   ALKPHOS 75  --   BILITOT 1.0  --     RADIOGRAPHIC STUDIES: I have personally reviewed the radiological images as listed and agreed with the findings in the report. Ct Abdomen Pelvis W Contrast  Result Date: 02/06/2016 CLINICAL DATA:  Gastric cancer dx'd February 2017, 9 chemotherapy sessions, hx of gastrectomy in July 2017,pt.state increased abdominal discomfort with constipation and unable to eat anything because of vomiting EXAM: CT ABDOMEN AND PELVIS WITH CONTRAST TECHNIQUE: Multidetector CT imaging of the abdomen and pelvis was performed using the standard protocol following bolus administration of intravenous contrast. CONTRAST:  25m ISOVUE-300 IOPAMIDOL (ISOVUE-300) INJECTION 61% COMPARISON:  Chest radiograph, 02/06/2016. FINDINGS: Lower chest: Mild dependent subsegmental atelectasis. Minimal right pleural fluid. Heart normal in size. Hepatobiliary: The 5 mm low-density lesion at the dome of the liver, nonspecific. No other liver masses or lesions. Some fluid lies along the falciform ligament. Liver normal in size. Gallbladder is unremarkable. No bile duct dilation. Pancreas: Unremarkable Spleen: Normal Adrenals/Urinary Tract: No adrenal masses. 12 mm low-density right kidney upper pole mass and 3.5 cm low-density midpole left renal mass, both cysts. No other renal masses or lesions, no stones and no hydronephrosis. Ureters normal in course and in caliber. Bladder is unremarkable. Stomach/Bowel/Peritoneal cavity: Moderate amount of ascites. Some of the fluid appears loculated adjacent to the liver and at the root of the small bowel mesentery. Cystic and solid-appearing masses in the adnexal regions of the pelvis, largest on  the left measuring 5.3 x 3.6 x 3.7 cm. There are reticulonodular type opacities in noted along the lower small bowel mesenteric. Changes from previous stomach surgery with formation of a gastrojejunostomy. First portion of the duodenum is been ligated in the right upper quadrant. No stomach mass. No evidence of bowel obstruction. No bowel wall thickening or inflammatory changes. Colon  is mildly distended with increased stool noted in the transverse and right colon. Normal appendix is visualized. Vascular/Lymphatic: Mild atherosclerotic calcifications noted along a normal caliber abdominal aorta. No discrete enlarged lymph nodes. Reproductive: Uterus is unremarkable. Bilateral cystic and solid adnexal masses as described. Musculoskeletal: Developmental anomaly with a small disc at the L4-L5 level. No osteoblastic or osteolytic lesions. Other: None IMPRESSION: 1. Moderate amount of ascites with reticular nodular type opacities noted along the small bowel mesentery. This is likely due to peritoneal carcinomatosis. There are cystic and solid adnexal masses which could reflect the ovaries, but are more suspicious for dependent peritoneal metastatic implants. 2. 5 mm low-density liver lesion, nonspecific, possibly metastatic disease but more likely small cyst. No other evidence of metastatic disease. 3. Changes from gastric surgery with formation of a gastrojejunostomy. No stomach mass is visualized. 4. Generalized increased stool in the colon. 5. Mild aortic atherosclerosis. Electronically Signed   By: Lajean Manes M.D.   On: 02/06/2016 11:06   Dg Abd Acute W/chest  Result Date: 02/06/2016 CLINICAL DATA:  55 year old female with history of stage IV stomach cancer and partial gastrectomy presenting with severe mid and lower abdominal pain. EXAM: DG ABDOMEN ACUTE W/ 1V CHEST COMPARISON:  None. FINDINGS: There is minimal left lung base atelectatic changes. A 12 mm rounded radiopaque nodular density in the right lower  lung field laterally in the seventh intercostal space likely corresponds to the nipple shadow. Repeat radiograph with nipple markers may provide better evaluation. The lungs are otherwise clear. There is no pleural effusion or pneumothorax. The cardiac silhouette is within normal limits. Right pectoral Port-A-Cath with tip at the cavoatrial junction. No acute osseous pathology. There is postsurgical changes of the bowel with multiple surgical sutures in the left hemi abdomen. Large amount of dense stool noted throughout the colon. There is no bowel dilatation or evidence of obstruction. No free air or radiopaque calculi noted. The soft tissues and the osseous structures are grossly unremarkable. IMPRESSION: Constipation.  No bowel obstruction or free air. A Next 12 mm right lung base nodular density likely corresponds to the nipple shadow. Right-sided Port-A-Cath with tip at the cavoatrial junction. Electronically Signed   By: Anner Crete M.D.   On: 02/06/2016 01:15    ASSESSMENT & PLAN: 55 year old Caucasian female, who was diagnosed with T3N0M0 stage IIIA gastric body adenocarcinoma, status post neoadjuvant chemotherapy, followed by partial gastrectomy. Unfortunately she was found to have peritoneal metastasis during the surgery. She now presents worsening abdominal K, nausea, anorexia, weight loss and constipation.  1. Intractable abdominal pain, likely secondary into the peritoneal carcinomatosis, rule out peritonitis  2. Moderate ascites, probable malignant 3. Metastatic gastric adenocarcinoma with metastasis to peritoneum 4. UTI 5. Anemia in new positive disease 6. Weight loss and severe malnutrition 7. Anorexia, constipation, and nausea  Recommendations: -I reviewed her CT scan findings with patient, unfortunately she has diffuse peritoneal carcinomatosis now -I recommend paracentesis for symptom relief, please send cultures to rule out infection, cytology to ruled out malignant  ascites -We discussed that her disease is incurable at this stage, and her treatment option is very limited due to her poor performance status. However, patient seems to be still in denial -due to her extremely poor PS, she is not a candidate for chemotherapy -I'll try to get her surgical sample for MSI testing to see if she would be a candidate for immunotherapy -pain control and symptom management  -Please consult palliative care for symptom management, and discuss the goal  of care -I think she is appropriate candidate for hospice, however patient seems to be in denial, not ready for hospice.  I'll discuss with her family members tomorrow. -I will follow up.   All questions were answered. The patient knows to call the clinic with any problems, questions or concerns.     Truitt Merle, MD 02/06/2016 10:01 PM

## 2016-02-06 NOTE — ED Notes (Signed)
Hospitalist notified of pt requesting pain medication. Verbal order for Dilaudid 0.5mg  IVP given by Dr. Cornelia Copa.

## 2016-02-06 NOTE — ED Notes (Signed)
Pt reports increase in pain after obtaining urine sample. MD notified.

## 2016-02-06 NOTE — ED Notes (Signed)
Awaiting hospitalist for admission orders.

## 2016-02-06 NOTE — Progress Notes (Signed)
Fleets enema administered with only flecks of stool returned.

## 2016-02-06 NOTE — Progress Notes (Signed)
Chaplain following in response to request by son.    Provided spiritual support with patient, son and daughter in law at bedside.    Will continue to follow for assessment and support during admission.       [place], [place]

## 2016-02-06 NOTE — ED Notes (Addendum)
Hospitalist at bedside to evaluate pt and made aware that pt is requesting pain medication.

## 2016-02-06 NOTE — H&P (Signed)
History and Physical    Selena Hayes [id number] DOB: [birth year] DOA: 02/05/2016  PCP: No PCP Per Patient  Patient coming from: [place].  Chief Complaint: Abdominal pain. Constipation.  HPI: Selena Hayes is a 55 y.o. 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 Patient was diagnosed with gastric cancer in February 2017 and had undergone 9 chemotherapy sessions followed by gastrectomy in July 2017, a month and a half ago. 2 weeks following which patient noticed increasing discolored fluid from the abdominal drain for which patient was prescribed antibiotics for possible infection and had taken for at least 10 days. Patient's drain was subsequently discontinued. Over the last 10 days patient has been having increasing discomfort with constipation and unable to eat anything because of vomiting. Patient had presented to the ER at [place], [place] on 02/04/2016 and had blood work and CT scan done.   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 A few bowel loops do appear slightly dilated and some degree of low-grade bowel obstruction is difficult to exclude.  Since patient's daughter has already had appointment with oncologist at [facility] long hospital patient was discharged and brought to the ER at [place] done shows hemoglobin of 7 which is a drop of almost 5 g from hemoglobin of 12 in [place] 2 days ago. Stool for occult blood was negative. Drop in hemoglobin is attributed to dilution from IV fluids. Acute abdominal series done shows constipation. Patient is being admitted for further pain management and abdominal pain.  ED  Course: Patient was given pain medications and Levaquin for UTI.  Review of Systems: As per HPI, rest all negative.   Past Medical History:  Diagnosis Date  . Cancer (HCC)    Stomach, Skin  . Tremors of nervous system     Past Surgical History:  Procedure Laterality Date  . GASTRECTOMY    . TUBAL LIGATION       reports that she has never smoked. She has never used smokeless tobacco. She reports that she does not drink alcohol or use drugs.  Allergies  Allergen Reactions  . Keflex [Cephalexin] Hives    Family History  Problem Relation Age of Onset  . CAD Mother   . Lung cancer Father   . Diabetes Brother     Prior to Admission medications   Medication Sig Start Date End Date Taking? Authorizing Provider  LORazepam (ATIVAN) 0.5 MG tablet Take 0.5 mg by mouth at bedtime.   Yes Historical Provider, MD  Multiple Vitamin (MULTIVITAMIN WITH MINERALS) TABS tablet Take 1 tablet by mouth daily.   Yes Historical Provider, MD  ondansetron (ZOFRAN-ODT) 4 MG disintegrating tablet Take 4 mg by mouth every 8 (eight) hours as needed for nausea or vomiting.   Yes Historical Provider, MD  oxyCODONE-acetaminophen (PERCOCET/ROXICET) 5-325 MG tablet Take 1-2 tablets by mouth every 4 (four) hours as needed for severe pain.   Yes Historical Provider, MD  pantoprazole (PROTONIX) 40 MG tablet Take 40 mg by mouth daily.   Yes Historical Provider, MD  polyethylene glycol (MIRALAX / GLYCOLAX) packet Take 17 g by mouth daily as needed for mild constipation.   Yes Historical Provider, MD  promethazine (PHENERGAN)  25 MG tablet Take 25 mg by mouth every 6 (six) hours as needed for nausea or vomiting.   Yes Historical Provider, MD    Physical Exam: Vitals:   02/06/16 0300 02/06/16 0330 02/06/16 0400 02/06/16 0518  BP: 131/91 106/88 138/83 127/85  Pulse: 102 112 97 104  Resp: 17 17 15 16   Temp:      TempSrc:      SpO2: 94% 91% 92% 94%  Weight:      Height:          Constitutional: Not in  distress. Vitals:   02/06/16 0300 02/06/16 0330 02/06/16 0400 02/06/16 0518  BP: 131/91 106/88 138/83 127/85  Pulse: 102 112 97 104  Resp: 17 17 15 16   Temp:      TempSrc:      SpO2: 94% 91% 92% 94%  Weight:      Height:       Eyes: Anicteric no pallor. ENMT: No discharge from the ears eyes nose or mouth. Neck: No mass felt. No neck rigidity. Respiratory: No rhonchi or crepitations. Cardiovascular: S1 and S2 heard. Abdomen: Distended firm no guarding or rigidity. No bowel sounds appreciated. Musculoskeletal: No edema. Skin: No rash. Neurologic: Alert awake oriented to time place and person. Moves all extremities. Psychiatric: Appears normal.   Labs on Admission: I have personally reviewed following labs and imaging studies  CBC:  Recent Labs Lab 02/06/16 0006 02/06/16 0027  WBC 9.1  --   NEUTROABS 6.7  --   HGB 7.7* 11.9*  HCT 23.0* 35.0*  MCV 89.1  --   PLT 416*  --    Basic Metabolic Panel:  Recent Labs Lab 02/06/16 0006 02/06/16 0027  NA 133* 132*  K 3.5 3.5  CL 97* 95*  CO2 25  --   GLUCOSE 105* 101*  BUN 12 10  CREATININE 0.58 0.50  CALCIUM 8.8*  --    GFR: Estimated Creatinine Clearance: 51.7 mL/min (by C-G formula based on SCr of 0.8 mg/dL). Liver Function Tests:  Recent Labs Lab 02/06/16 0006  AST 15  ALT 17  ALKPHOS 75  BILITOT 1.0  PROT 6.6  ALBUMIN 2.7*    Recent Labs Lab 02/06/16 0006  LIPASE 15   No results for input(s): AMMONIA in the last 168 hours. Coagulation Profile: No results for input(s): INR, PROTIME in the last 168 hours. Cardiac Enzymes: No results for input(s): CKTOTAL, CKMB, CKMBINDEX, TROPONINI in the last 168 hours. BNP (last 3 results) No results for input(s): PROBNP in the last 8760 hours. HbA1C: No results for input(s): HGBA1C in the last 72 hours. CBG: No results for input(s): GLUCAP in the last 168 hours. Lipid Profile: No results for input(s): CHOL, HDL, LDLCALC, TRIG, CHOLHDL, LDLDIRECT in the  last 72 hours. Thyroid Function Tests: No results for input(s): TSH, T4TOTAL, FREET4, T3FREE, THYROIDAB in the last 72 hours. Anemia Panel: No results for input(s): VITAMINB12, FOLATE, FERRITIN, TIBC, IRON, RETICCTPCT in the last 72 hours. Urine analysis:    Component Value Date/Time   COLORURINE YELLOW 02/06/2016 0204   APPEARANCEUR CLOUDY (A) 02/06/2016 0204   LABSPEC 1.011 02/06/2016 0204   PHURINE 6.0 02/06/2016 0204   GLUCOSEU NEGATIVE 02/06/2016 0204   HGBUR TRACE (A) 02/06/2016 0204   BILIRUBINUR NEGATIVE 02/06/2016 0204   KETONESUR 40 (A) 02/06/2016 0204   PROTEINUR NEGATIVE 02/06/2016 0204   NITRITE NEGATIVE 02/06/2016 0204   LEUKOCYTESUR LARGE (A) 02/06/2016 0204   Sepsis Labs: @LABRCNTIP (procalcitonin:4,lacticidven:4) )No results found for this or any  previous visit (from the past 240 hour(s)).   Radiological Exams on Admission: Dg Abd Acute W/chest  Result Date: 02/06/2016 CLINICAL DATA:  55 year old female with history of stage IV stomach cancer and partial gastrectomy presenting with severe mid and lower abdominal pain. EXAM: DG ABDOMEN ACUTE W/ 1V CHEST COMPARISON:  None. FINDINGS: There is minimal left lung base atelectatic changes. A 12 mm rounded radiopaque nodular density in the right lower lung field laterally in the seventh intercostal space likely corresponds to the nipple shadow. Repeat radiograph with nipple markers may provide better evaluation. The lungs are otherwise clear. There is no pleural effusion or pneumothorax. The cardiac silhouette is within normal limits. Right pectoral Port-A-Cath with tip at the cavoatrial junction. No acute osseous pathology. There is postsurgical changes of the bowel with multiple surgical sutures in the left hemi abdomen. Large amount of dense stool noted throughout the colon. There is no bowel dilatation or evidence of obstruction. No free air or radiopaque calculi noted. The soft tissues and the osseous structures are grossly  unremarkable. IMPRESSION: Constipation.  No bowel obstruction or free air. A Next 12 mm right lung base nodular density likely corresponds to the nipple shadow. Right-sided Port-A-Cath with tip at the cavoatrial junction. Electronically Signed   By: Anner Crete M.D.   On: 02/06/2016 01:15     Assessment/Plan Principal Problem:   Abdominal pain Active Problems:   UTI (lower urinary tract infection)   Absolute anemia   Gastric cancer ([place])    1. Abdominal pain with constipation - could be from patient's known metastatic cancer with CT showing peritoneal carcinomatosis. Patient also has constipation and large ascites which could all be contributing to patient's discomfort. However CT scan was not very conclusive about possible bowel obstruction at [facility]. At this time I have ordered a repeat CT scan which is pending. Please notify Dr. Burr Medico, oncologist in a.m. with whom patient has appointment. If CT scan does show obstruction may need surgical consult. Continue with pain relief medication check for hydration and patient is kept nothing by mouth for now. 2. UTI - patient is placed on Levaquin. Follow urine cultures. 3. Normocytic anemia - closely follow CBC for any further worsening may need transfusion. 4. Gastric cancer with metastasis - has had received chemotherapy and has had gastrectomy. See history of present illness. Consult oncologist.   DVT prophylaxis: SCDs. Code Status: Full code.  Family Communication: Patient's daughter.  Disposition Plan: To be determined.  Consults called: None.  Admission status: Inpatient. Likely stay 2-3 days.    Rise Patience MD [facility] Pager [phone].  If 7PM-7AM, please contact night-coverage [email or web address] Password [email or web address]  02/06/2016, 6:03 AM

## 2016-02-06 NOTE — ED Notes (Signed)
Per Dr.Palumbo port can be used for medications and fluids. Port accessed without difficulty positive blood return and Chest x-ray done.

## 2016-02-07 ENCOUNTER — Ambulatory Visit: Payer: Self-pay | Admitting: Nurse Practitioner

## 2016-02-07 ENCOUNTER — Telehealth: Payer: Self-pay | Admitting: *Deleted

## 2016-02-07 ENCOUNTER — Inpatient Hospital Stay (HOSPITAL_COMMUNITY): Payer: Medicaid - Out of State

## 2016-02-07 ENCOUNTER — Ambulatory Visit: Payer: Self-pay | Admitting: Hematology

## 2016-02-07 DIAGNOSIS — Z515 Encounter for palliative care: Secondary | ICD-10-CM

## 2016-02-07 LAB — COMPREHENSIVE METABOLIC PANEL
ALT: 13 U/L — ABNORMAL LOW (ref 14–54)
ANION GAP: 6 (ref 5–15)
AST: 15 U/L (ref 15–41)
Albumin: 2.3 g/dL — ABNORMAL LOW (ref 3.5–5.0)
Alkaline Phosphatase: 60 U/L (ref 38–126)
BUN: 9 mg/dL (ref 6–20)
CHLORIDE: 105 mmol/L (ref 101–111)
CO2: 27 mmol/L (ref 22–32)
CREATININE: 0.44 mg/dL (ref 0.44–1.00)
Calcium: 8.6 mg/dL — ABNORMAL LOW (ref 8.9–10.3)
GFR calc non Af Amer: >60 mL/min (ref >60)
Glucose, Bld: 118 mg/dL — ABNORMAL HIGH (ref 65–99)
Potassium: 3.4 mmol/L — ABNORMAL LOW (ref 3.5–5.1)
SODIUM: 138 mmol/L (ref 135–145)
TOTAL PROTEIN: 5.8 g/dL — AB (ref 6.5–8.1)
Total Bilirubin: 0.4 mg/dL (ref 0.3–1.2)

## 2016-02-07 LAB — CBC
HCT: 30.1 % — ABNORMAL LOW (ref 36.0–46.0)
Hemoglobin: 10 g/dL — ABNORMAL LOW (ref 12.0–15.0)
MCH: 29.8 pg (ref 26.0–34.0)
MCHC: 33.2 g/dL (ref 30.0–36.0)
MCV: 89.6 fL (ref 78.0–100.0)
PLATELETS: 318 10*3/uL (ref 150–400)
RBC: 3.36 MIL/uL — ABNORMAL LOW (ref 3.87–5.11)
RDW: 13.9 % (ref 11.5–15.5)
WBC: 5.5 10*3/uL (ref 4.0–10.5)

## 2016-02-07 LAB — ALBUMIN, FLUID (OTHER): Albumin, Fluid: 1.9 g/dL

## 2016-02-07 LAB — LACTATE DEHYDROGENASE, PLEURAL OR PERITONEAL FLUID: LD FL: 184 U/L — AB (ref 3–23)

## 2016-02-07 LAB — GRAM STAIN

## 2016-02-07 LAB — GLUCOSE, CAPILLARY
GLUCOSE-CAPILLARY: 117 mg/dL — AB (ref 65–99)
GLUCOSE-CAPILLARY: 119 mg/dL — AB (ref 65–99)
Glucose-Capillary: 112 mg/dL — ABNORMAL HIGH (ref 65–99)
Glucose-Capillary: 104 mg/dL — ABNORMAL HIGH (ref 65–99)

## 2016-02-07 LAB — PROTEIN, BODY FLUID: Total protein, fluid: 3.8 g/dL

## 2016-02-07 MED ORDER — DIPHENHYDRAMINE HCL 12.5 MG/5ML PO ELIX: 12.5000 mg | ORAL_SOLUTION | Freq: Four times a day (QID) | ORAL | Status: DC | PRN

## 2016-02-07 MED ORDER — DIPHENHYDRAMINE HCL 50 MG/ML IJ SOLN
12.5000 mg | Freq: Four times a day (QID) | INTRAMUSCULAR | Status: DC | PRN
  Administered 2016-02-08: 12.5 mg via INTRAVENOUS
  Filled 2016-02-07: qty 1

## 2016-02-07 MED ORDER — HYDROMORPHONE 1 MG/ML IV SOLN
INTRAVENOUS | Status: DC
  Administered 2016-02-07: 11:00:00 via INTRAVENOUS
  Administered 2016-02-07: 1.06 mg via INTRAVENOUS
  Administered 2016-02-07: 2.73 mg via INTRAVENOUS
  Administered 2016-02-07: 3.85 mg via INTRAVENOUS
  Administered 2016-02-07: 3.06 mg via INTRAVENOUS
  Administered 2016-02-08: 13.64 mg via INTRAVENOUS
  Administered 2016-02-08: 13.29 mg via INTRAVENOUS
  Administered 2016-02-08: 8.79 mg via INTRAVENOUS
  Administered 2016-02-08: 1.17 mg via INTRAVENOUS
  Administered 2016-02-08: 16:00:00 via INTRAVENOUS
  Administered 2016-02-08: 7.46 mg via INTRAVENOUS
  Administered 2016-02-09: 8.5 mg via INTRAVENOUS
  Administered 2016-02-09: 21.34 mg via INTRAVENOUS
  Administered 2016-02-09: via INTRAVENOUS
  Administered 2016-02-09: 25 mg via INTRAVENOUS
  Filled 2016-02-07 (×6): qty 25

## 2016-02-07 MED ORDER — METHYLNALTREXONE BROMIDE 12 MG/0.6ML ~~LOC~~ SOLN: 12.0000 mg | Freq: Once | SUBCUTANEOUS | Status: DC

## 2016-02-07 MED ORDER — PROMETHAZINE HCL 25 MG/ML IJ SOLN
12.5000 mg | Freq: Three times a day (TID) | INTRAMUSCULAR | Status: DC | PRN
  Administered 2016-02-07 – 2016-02-12 (×4): 12.5 mg via INTRAVENOUS
  Filled 2016-02-07 (×5): qty 1

## 2016-02-07 MED ORDER — ONDANSETRON HCL 4 MG/2ML IJ SOLN
4.0000 mg | Freq: Four times a day (QID) | INTRAMUSCULAR | Status: DC | PRN
  Administered 2016-02-07 – 2016-02-16 (×17): 4 mg via INTRAVENOUS
  Filled 2016-02-07 (×17): qty 2

## 2016-02-07 MED ORDER — SENNA 8.6 MG PO TABS
2.0000 | ORAL_TABLET | Freq: Every day | ORAL | Status: DC
  Administered 2016-02-07 – 2016-02-15 (×8): 17.2 mg via ORAL
  Filled 2016-02-07 (×8): qty 2

## 2016-02-07 MED ORDER — DEXTROSE-NACL 5-0.9 % IV SOLN
INTRAVENOUS | Status: DC
  Administered 2016-02-07: 11:00:00 via INTRAVENOUS

## 2016-02-07 MED ORDER — NALOXONE HCL 0.4 MG/ML IJ SOLN: 0.4000 mg | INTRAMUSCULAR | Status: DC | PRN

## 2016-02-07 MED ORDER — METHYLNALTREXONE BROMIDE 12 MG/0.6ML ~~LOC~~ SOLN
8.0000 mg | Freq: Once | SUBCUTANEOUS | Status: AC | PRN
  Administered 2016-02-07: 8 mg via SUBCUTANEOUS
  Filled 2016-02-07: qty 0.6

## 2016-02-07 MED ORDER — SODIUM CHLORIDE 0.9% FLUSH: 9.0000 mL | INTRAVENOUS | Status: DC | PRN

## 2016-02-07 MED ORDER — POLYETHYLENE GLYCOL 3350 17 G PO PACK
17.0000 g | PACK | Freq: Two times a day (BID) | ORAL | Status: DC
  Administered 2016-02-07 – 2016-02-10 (×7): 17 g via ORAL
  Filled 2016-02-07 (×7): qty 1

## 2016-02-07 MED ORDER — OXYCODONE HCL ER 10 MG PO T12A: 10.0000 mg | EXTENDED_RELEASE_TABLET | Freq: Two times a day (BID) | ORAL | Status: DC

## 2016-02-07 MED ORDER — FLEET ENEMA 7-19 GM/118ML RE ENEM
1.0000 | ENEMA | Freq: Once | RECTAL | Status: AC
  Administered 2016-02-07: 1 via RECTAL
  Filled 2016-02-07: qty 1

## 2016-02-07 NOTE — Consult Note (Signed)
Consultation Note Date: 02/07/2016   Patient Name: Selena Hayes  DOB: [birth year]  MRN: [id number]  Age / Sex: 55 y.o., female  PCP: No Pcp Per Patient Referring Physician: Kinnie Feil, MD  Reason for Consultation: Establishing goals of care  HPI/Patient Profile: 55 y.o. female   admitted on 02/05/2016   Life limiting illness metastatic gastric cancer  Clinical Assessment and Goals of Care:  55 year old lady diagnosed with gastric body adenocarcinoma earlier this year in [place], status post chemotherapy, status post surgery-partial gastrectomy. Recently diagnosed with peritoneal metastases, admitted after daughter brought her in [place] from [place] for ongoing weight loss, nausea, anorexia, uncontrolled abdominal pain, constipation. Patient has been admitted to hospitalist service with intractable abdominal pain deemed likely secondary to peritoneal carcinomatosis. Patient has probable malignant moderate degree of ascites. CT scan shows diffuse peritoneal carcinomatosis. She is to undergo paracenteses for symptom relief. She has been seen and evaluated by oncology here in this hospitalization at [facility] long. She is noted to have poor performance status and incurable stage of disease. She is not a candidate for chemotherapy. Palliative care consultation for pain control and symptom management of constipation. Additionally, palliative consultation to approach hospice discussions.  Patient seen and examined. Resting in bed. Complains of diffuse abdominal discomfort. Tearful because of uncontrolled pain. I introduced palliative care as follows: Palliative medicine is specialized medical care for people living with serious illness. It focuses on providing relief from the symptoms and stress of a serious illness. The goal is to improve quality of life for both the patient and the family.  Patient is  receiving IV Dilaudid for pain control. She has received around 22.5 mg of IV Dilaudid since her hospitalization. Currently, she is on 2 mg IV every 2 hours of Dilaudid. She states that the pain relief lasts for a maximum of hour 45 minutes after that she is in pain. She has been tried on a fentanyl patch in the outpatient setting in [place]. She states she has intractable nausea and vomiting and does not want to try fentanyl patch for long-term pain control. Patient complains of mild degree of nausea no vomiting noted. She does not believe she has had a bowel movement for the last month. She has been started on an excellent bowel regimen here.  Discussed with patient about starting Dilaudid PCA basal and bolus doses for adequate pain control. Discussed about appropriate stool regimen. Agree with using Relistor again as a 1. Otherwise, augment bowel regimen with addition of senna and MiraLAX on a scheduled basis. Patient has been given an enema yesterday night with minimal results. Continue to follow along. Thank you for the consult. Patient becomes tearful and states she has hope that she will be eligible for some type of therapy/treatment even if it is palliative in nature. Her goal is life prolongation within reasonable parameters because of her children and grandchildren. She is happy to be here in [place] with her family. Endorsed her willingness to maintain hope. Provided supportive care and active  listening. Agreed with the patient that she does however have a terminal illness that is rapidly advancing. Discussed extensively about recent CT scan abdomen and pelvis results. Patient states she understands.  NEXT OF KIN  Daughter Crista Elliot at [phone]. Patient also has a son. Patient has several grandchildren.  SUMMARY OF RECOMMENDATIONS    1. Dilaudid PCA 2. Augment bowel regimen 3. As needed medications for nausea/vomiting 4. Plan is to gently approach CODE STATUS and goals of  care discussions, hospice eligibility discussions with patient and daughter over the weekend.  Code Status/Advance Care Planning:  Full code    Symptom Management:    As above  Palliative Prophylaxis:   Bowel Regimen   Psycho-social/Spiritual:   Desire for further Chaplaincy support:no  Additional Recommendations: Caregiving  Support/Resources  Prognosis:   < 4 weeks  Discharge Planning: Hospice facility versus home with hospice support seems the most appropriate. How ever, patient does not wish to discuss this with [place] today 9-8       Primary Diagnoses: Present on Admission: . Abdominal pain . UTI (lower urinary tract infection) . Absolute anemia . Gastric adenocarcinoma ([place])   I have reviewed the medical record, interviewed the patient and family, and examined the patient. The following aspects are pertinent.  Past Medical History:  Diagnosis Date  . Cancer (HCC)    Stomach, Skin  . Tremors of nervous system    Social History   Social History  . Marital status: Divorced    Spouse name: N/A  . Number of children: N/A  . Years of education: N/A   Social History Main Topics  . Smoking status: Never Smoker  . Smokeless tobacco: Never Used  . Alcohol use No  . Drug use: No  . Sexual activity: Not Asked   Other Topics Concern  . None   Social History Narrative  . None   Family History  Problem Relation Age of Onset  . CAD Mother   . Lung cancer Father   . Diabetes Brother    Scheduled Meds: . HYDROmorphone   Intravenous Q4H  . levofloxacin (LEVAQUIN) IV  500 mg Intravenous Q24H  . nicotine  14 mg Transdermal Daily  . polyethylene glycol  17 g Oral BID  . senna  2 tablet Oral QHS   Continuous Infusions: . dextrose 5 % and 0.9% NaCl 75 mL/hr at 02/07/16 1104   PRN Meds:.acetaminophen **OR** acetaminophen, bisacodyl, diphenhydrAMINE **OR** diphenhydrAMINE, HYDROmorphone (DILAUDID) injection, iopamidol, methylnaltrexone, naloxone **AND**  sodium chloride flush, ondansetron (ZOFRAN) IV, ondansetron **OR** [DISCONTINUED] ondansetron (ZOFRAN) IV, promethazine, sodium chloride flush Medications Prior to Admission:  Prior to Admission medications   Medication Sig Start Date End Date Taking? Authorizing Provider  LORazepam (ATIVAN) 0.5 MG tablet Take 0.5 mg by mouth at bedtime.   Yes Historical Provider, MD  Multiple Vitamin (MULTIVITAMIN WITH MINERALS) TABS tablet Take 1 tablet by mouth daily.   Yes Historical Provider, MD  ondansetron (ZOFRAN-ODT) 4 MG disintegrating tablet Take 4 mg by mouth every 8 (eight) hours as needed for nausea or vomiting.   Yes Historical Provider, MD  oxyCODONE-acetaminophen (PERCOCET/ROXICET) 5-325 MG tablet Take 1-2 tablets by mouth every 4 (four) hours as needed for severe pain.   Yes Historical Provider, MD  pantoprazole (PROTONIX) 40 MG tablet Take 40 mg by mouth daily.   Yes Historical Provider, MD  polyethylene glycol (MIRALAX / GLYCOLAX) packet Take 17 g by mouth daily as needed for mild constipation.   Yes Historical Provider, MD  promethazine (  PHENERGAN) 25 MG tablet Take 25 mg by mouth every 6 (six) hours as needed for nausea or vomiting.   Yes Historical Provider, MD   Allergies  Allergen Reactions  . Keflex [Cephalexin] Hives   Review of Systems + for abdominal pain, + for back pain + for constipation  Physical Exam Elderly appearing cachectic lady resting in bed appears in mild to moderate distress due to uncontrolled pain Pale in appearance S1-S2 Lungs clear to auscultation No edema on lower extremities Then, cancer related cachexia Awake alert oriented no focal deficits  Vital Signs: BP (!) 127/91 (BP Location: Left Arm)   Pulse 90   Temp 98.2 F (36.8 C) (Oral)   Resp 14   Ht 5\' 2"  (1.575 m)   Wt 47.7 kg (105 lb 2.6 oz)   SpO2 97%   BMI 19.23 kg/m  Pain Assessment: 0-10 POSS *See Group Information*: 1-Acceptable,Awake and alert Pain Score: 8    SpO2: SpO2: 97 % O2  Device:SpO2: 97 % O2 Flow Rate: .   IO: Intake/output summary:  Intake/Output Summary (Last 24 hours) at 02/07/16 1414 Last data filed at 02/07/16 [id number]  Gross per 24 hour  Intake             1795 ml  Output              200 ml  Net             1595 ml    LBM: Last BM Date:  (one month ago per patient ) Baseline Weight: Weight: 41.7 kg (92 lb) Most recent weight: Weight: 47.7 kg (105 lb 2.6 oz)     Palliative Assessment/Data:   Flowsheet Rows   Flowsheet Row Most Recent Value  Intake Tab  Referral Department  Oncology  Unit at Time of Referral  Oncology Unit  Palliative Care Primary Diagnosis  Cancer  Date Notified  02/06/16  Palliative Care Type  New Palliative care  Reason for referral  Clarify Goals of Care, Pain, Non-pain Symptom  Date of Admission  02/05/16  Date first seen by Palliative Care  02/07/16  # of days IP prior to Palliative referral  1  Clinical Assessment  Palliative Performance Scale Score  30%  Pain Max last 24 hours  5  Pain Min Last 24 hours  4  Dyspnea Max Last 24 Hours  4  Dyspnea Min Last 24 hours  3  Nausea Max Last 24 Hours  4  Nausea Min Last 24 Hours  3  Anxiety Max Last 24 Hours  5  Anxiety Min Last 24 Hours  4  Psychosocial & Spiritual Assessment  Palliative Care Outcomes  Patient/Family meeting held?  Yes  Who was at the meeting?  patient       Time In: 9 am  Time Out: 1030 Time Total: 90 min  Greater than 50%  of this time was spent counseling and coordinating care related to the above assessment and plan.  Signed by: Loistine Chance, MD  [id number] Please contact Palliative Medicine Team phone at [phone] for questions and concerns.  For individual provider: See Shea Evans

## 2016-02-07 NOTE — Telephone Encounter (Signed)
I have called her back and left a message on her phone.   Truitt Merle MD

## 2016-02-07 NOTE — Progress Notes (Signed)
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 Patient received received 3 cycles chemotherapy. She has been having abdominal pain since the surgery, it has getting worse daily, also developed 25 pounds weight loss in the past few months. -ED: patient is uncomfortable with severe abdominal pains. CT abd showed peritoneal carcinomatosis, ascites   Assessment/Plan:  Intractable abdominal pains in the setting of metastatic cancer, peritoneal carcinomatosis. Patient was on fentanyl patch+prn percocet prior to admission  -she reports better pain control with iv dilaudid, d/w patient will start scheduled OxyContin if tolerated BID, if not will consider fentanyl patch. cont bowel regimen. Obtain palliative paracentesis    Metastatic gastric cancer with metastasis to peritoneal, s/p gastrectomy. Prognosis poor, appreciate oncology input. Consulted palliative care  UTI. Cont levofloxacin, pend cultrures    Code Status: full Family Communication: d/w patient, Therapist, sports. No family at the bedside  (indicate person spoken with, relationship, and if by phone, the number) Disposition Plan: pend clinical improvement    Consultants:  Palliative care   Procedures:  CT abd   Antibiotics:  Levofloxacin 9/7>>> (indicate start date, and stop date if known)  HPI/Subjective: Alert, reports abdominal pains. But improved with dilaudid. No SOB. No Chest pain.   Objective: Vitals:   02/06/16 1400 02/07/16  0544  BP: 127/88 (!) 127/91  Pulse: 97 90  Resp: 16 14  Temp: 98.1 F (36.7 C) 98.2 F (36.8 C)    Intake/Output Summary (Last 24 hours) at 02/07/16 0853 Last data filed at 02/07/16 0517  Gross per 24 hour  Intake             1795 ml  Output              200 ml  Net             1595 ml   Filed Weights   02/05/16 2344 02/06/16 0628  Weight: 41.7 kg (92 lb) 47.7 kg (105 lb 2.6 oz)    Exam:   General:  Alert, no distress   Cardiovascular: s1,s2 rrr  Respiratory: CTA BL   Abdomen: mild distended, mild tender   Musculoskeletal: no leg edema    Data Reviewed: Basic Metabolic Panel:  Recent Labs Lab 02/06/16 0006 02/06/16 0027 02/07/16 0510  NA 133* 132* 138  K 3.5 3.5 3.4*  CL 97* 95* 105  CO2 25  --  27  GLUCOSE 105* 101* 118*  BUN 12 10 9   CREATININE 0.58 0.50 0.44  CALCIUM 8.8*  --  8.6*   Liver Function Tests:  Recent Labs Lab 02/06/16 0006 02/07/16 0510  AST 15 15  ALT 17 13*  ALKPHOS 75 60  BILITOT 1.0 0.4  PROT 6.6 5.8*  ALBUMIN 2.7* 2.3*    Recent Labs Lab 02/06/16 0006  LIPASE 15   No results for input(s): AMMONIA in the last 168 hours. CBC:  Recent Labs Lab 02/06/16 0006 02/06/16 0027 02/07/16 0510  WBC 9.1  --  5.5  NEUTROABS 6.7  --   --   HGB 7.7*  11.9* 10.0*  HCT 23.0* 35.0* 30.1*  MCV 89.1  --  89.6  PLT 416*  --  318   Cardiac Enzymes: No results for input(s): CKTOTAL, CKMB, CKMBINDEX, TROPONINI in the last 168 hours. BNP (last 3 results) No results for input(s): BNP in the last 8760 hours.  ProBNP (last 3 results) No results for input(s): PROBNP in the last 8760 hours.  CBG:  Recent Labs Lab 02/06/16 0939 02/06/16 1610 02/07/16 0006 02/07/16 0828  GLUCAP 106* 115* 117* 112*    No results found for this or any previous visit (from the past 240 hour(s)).   Studies: Ct Abdomen Pelvis W Contrast  Result Date: 02/06/2016 CLINICAL DATA:  Gastric cancer dx'd February 2017, 9 chemotherapy sessions, hx of  gastrectomy in July 2017,pt.state increased abdominal discomfort with constipation and unable to eat anything because of vomiting EXAM: CT ABDOMEN AND PELVIS WITH CONTRAST TECHNIQUE: Multidetector CT imaging of the abdomen and pelvis was performed using the standard protocol following bolus administration of intravenous contrast. CONTRAST:  12mL ISOVUE-300 IOPAMIDOL (ISOVUE-300) INJECTION 61% COMPARISON:  Chest radiograph, 02/06/2016. FINDINGS: Lower chest: Mild dependent subsegmental atelectasis. Minimal right pleural fluid. Heart normal in size. Hepatobiliary: The 5 mm low-density lesion at the dome of the liver, nonspecific. No other liver masses or lesions. Some fluid lies along the falciform ligament. Liver normal in size. Gallbladder is unremarkable. No bile duct dilation. Pancreas: Unremarkable Spleen: Normal Adrenals/Urinary Tract: No adrenal masses. 12 mm low-density right kidney upper pole mass and 3.5 cm low-density midpole left renal mass, both cysts. No other renal masses or lesions, no stones and no hydronephrosis. Ureters normal in course and in caliber. Bladder is unremarkable. Stomach/Bowel/Peritoneal cavity: Moderate amount of ascites. Some of the fluid appears loculated adjacent to the liver and at the root of the small bowel mesentery. Cystic and solid-appearing masses in the adnexal regions of the pelvis, largest on the left measuring 5.3 x 3.6 x 3.7 cm. There are reticulonodular type opacities in noted along the lower small bowel mesenteric. Changes from previous stomach surgery with formation of a gastrojejunostomy. First portion of the duodenum is been ligated in the right upper quadrant. No stomach mass. No evidence of bowel obstruction. No bowel wall thickening or inflammatory changes. Colon is mildly distended with increased stool noted in the transverse and right colon. Normal appendix is visualized. Vascular/Lymphatic: Mild atherosclerotic calcifications noted along a normal caliber  abdominal aorta. No discrete enlarged lymph nodes. Reproductive: Uterus is unremarkable. Bilateral cystic and solid adnexal masses as described. Musculoskeletal: Developmental anomaly with a small disc at the L4-L5 level. No osteoblastic or osteolytic lesions. Other: None IMPRESSION: 1. Moderate amount of ascites with reticular nodular type opacities noted along the small bowel mesentery. This is likely due to peritoneal carcinomatosis. There are cystic and solid adnexal masses which could reflect the ovaries, but are more suspicious for dependent peritoneal metastatic implants. 2. 5 mm low-density liver lesion, nonspecific, possibly metastatic disease but more likely small cyst. No other evidence of metastatic disease. 3. Changes from gastric surgery with formation of a gastrojejunostomy. No stomach mass is visualized. 4. Generalized increased stool in the colon. 5. Mild aortic atherosclerosis. Electronically Signed   By: Lajean Manes M.D.   On: 02/06/2016 11:06   Dg Abd Acute W/chest  Result Date: 02/06/2016 CLINICAL DATA:  55 year old female with history of stage IV stomach cancer and partial gastrectomy presenting with severe mid and lower abdominal pain. EXAM: DG ABDOMEN ACUTE W/ 1V CHEST COMPARISON:  None.  FINDINGS: There is minimal left lung base atelectatic changes. A 12 mm rounded radiopaque nodular density in the right lower lung field laterally in the seventh intercostal space likely corresponds to the nipple shadow. Repeat radiograph with nipple markers may provide better evaluation. The lungs are otherwise clear. There is no pleural effusion or pneumothorax. The cardiac silhouette is within normal limits. Right pectoral Port-A-Cath with tip at the cavoatrial junction. No acute osseous pathology. There is postsurgical changes of the bowel with multiple surgical sutures in the left hemi abdomen. Large amount of dense stool noted throughout the colon. There is no bowel dilatation or evidence of  obstruction. No free air or radiopaque calculi noted. The soft tissues and the osseous structures are grossly unremarkable. IMPRESSION: Constipation.  No bowel obstruction or free air. A Next 12 mm right lung base nodular density likely corresponds to the nipple shadow. Right-sided Port-A-Cath with tip at the cavoatrial junction. Electronically Signed   By: Anner Crete M.D.   On: 02/06/2016 01:15    Scheduled Meds: . levofloxacin (LEVAQUIN) IV  500 mg Intravenous Q24H  . nicotine  14 mg Transdermal Daily   Continuous Infusions:   Principal Problem:   Abdominal pain Active Problems:   UTI (lower urinary tract infection)   Absolute anemia   Gastric adenocarcinoma (HCC)   Cancer related pain   Constipation    Time spent: >35 minutes     Kinnie Feil  [facility] Pager [phone]. If 7PM-7AM, please contact night-coverage at [email or web address], password [facility] 02/07/2016, 8:53 AM  LOS: 1 day

## 2016-02-07 NOTE — Procedures (Signed)
Ultrasound-guided diagnostic and therapeutic paracentesis performed yielding 2.8 liters of yellow fluid. No immediate complications. A portion of the fluid was sent to the lab for preordered studies.   

## 2016-02-07 NOTE — Telephone Encounter (Signed)
Received call from pt's daughter, [name], asking for information regarding her mom since she was not there when Dr Burr Medico came yest.  She states that her mother told her that she was too weak for chemo & that the cancer had spread & asked her to call Dr Burr Medico for more info.  She can be reahe at [phone].

## 2016-02-08 DIAGNOSIS — Z66 Do not resuscitate: Secondary | ICD-10-CM

## 2016-02-08 DIAGNOSIS — K5901 Slow transit constipation: Secondary | ICD-10-CM

## 2016-02-08 DIAGNOSIS — Z7189 Other specified counseling: Secondary | ICD-10-CM

## 2016-02-08 DIAGNOSIS — R180 Malignant ascites: Secondary | ICD-10-CM

## 2016-02-08 LAB — GLUCOSE, CAPILLARY
GLUCOSE-CAPILLARY: 132 mg/dL — AB (ref 65–99)
Glucose-Capillary: 117 mg/dL — ABNORMAL HIGH (ref 65–99)
Glucose-Capillary: 152 mg/dL — ABNORMAL HIGH (ref 65–99)
Glucose-Capillary: 120 mg/dL — ABNORMAL HIGH (ref 65–99)

## 2016-02-08 LAB — URINE CULTURE: Culture: NO GROWTH

## 2016-02-08 MED ORDER — MILK AND MOLASSES ENEMA
1.0000 | Freq: Once | RECTAL | Status: DC
  Filled 2016-02-08 (×2): qty 250

## 2016-02-08 MED ORDER — LEVOFLOXACIN IN D5W 250 MG/50ML IV SOLN
250.0000 mg | INTRAVENOUS | Status: DC
  Administered 2016-02-09: 250 mg via INTRAVENOUS
  Filled 2016-02-08: qty 50

## 2016-02-08 MED ORDER — MILK AND MOLASSES ENEMA
1.0000 | Freq: Once | RECTAL | Status: DC | PRN
  Filled 2016-02-08: qty 250

## 2016-02-08 MED ORDER — BOOST / RESOURCE BREEZE PO LIQD
1.0000 | Freq: Three times a day (TID) | ORAL | Status: DC
  Administered 2016-02-08 – 2016-02-11 (×4): 1 via ORAL

## 2016-02-08 MED ORDER — LACTULOSE 10 GM/15ML PO SOLN
20.0000 g | Freq: Three times a day (TID) | ORAL | Status: DC
  Administered 2016-02-08 – 2016-02-13 (×11): 20 g via ORAL
  Filled 2016-02-08 (×13): qty 30

## 2016-02-08 NOTE — Progress Notes (Signed)
Daily Progress Note   Patient Name: Selena Hayes       Date: 02/08/2016 DOB: Nov 04, 1960  Age: 55 y.o. MRN#: [id number] Attending Physician: Geradine Girt, DO Primary Care Physician: No PCP Per Patient Admit Date: 02/05/2016  Reason for Consultation/Follow-up: Establishing goals of care  Life limiting illness of metastatic gastric cancer Subjective:  pain is reasonably well controlled, patient using demand boluses as needed Is awake alert Slept well Daughter at bedside, wishes to discuss further with oncology about immunotherapy.  No BM yet.   Length of Stay: 2  Current Medications: Scheduled Meds:  . feeding supplement  1 Container Oral TID BM  . HYDROmorphone   Intravenous Q4H  . [START ON 02/09/2016] levofloxacin (LEVAQUIN) IV  250 mg Intravenous Q24H  . milk and molasses  1 enema Rectal Once  . nicotine  14 mg Transdermal Daily  . polyethylene glycol  17 g Oral BID  . senna  2 tablet Oral QHS    Continuous Infusions:    PRN Meds: acetaminophen **OR** acetaminophen, bisacodyl, diphenhydrAMINE **OR** diphenhydrAMINE, HYDROmorphone (DILAUDID) injection, iopamidol, naloxone **AND** sodium chloride flush, ondansetron (ZOFRAN) IV, ondansetron **OR** [DISCONTINUED] ondansetron (ZOFRAN) IV, promethazine, sodium chloride flush  Physical Exam         Weak frail elderly lady resting in bed S1-S2 Clear breath sounds anteriorly Abdomen firm, less distended-patient went for paracenteses yesterday Extremities warm to touch no edema Awake alert oriented  Vital Signs: BP 111/88 (BP Location: Left Arm)   Pulse 96   Temp 98.4 F (36.9 C) (Oral)   Resp 15   Ht _0  (1.575 m)   Wt 47.7 kg (105 lb 2.6 oz)   SpO2 96%   BMI 19.23 kg/m  SpO2: SpO2: 96 % O2 Device: O2 Device: Not  Delivered O2 Flow Rate:    Intake/output summary:  Intake/Output Summary (Last 24 hours) at 02/08/16 1100 Last data filed at 02/08/16 0451  Gross per 24 hour  Intake          1790.72 ml  Output              350 ml  Net          1440.72 ml   LBM: Last BM Date:  (pta month ago) Baseline Weight: Weight: 41.7 kg (92 lb) Most recent weight: Weight:  47.7 kg (105 lb 2.6 oz)       Palliative Assessment/Data:    Flowsheet Rows   Flowsheet Row Most Recent Value  Intake Tab  Referral Department  Hospitalist  Unit at Time of Referral  Oncology Unit  Palliative Care Primary Diagnosis  Cancer  Date Notified  02/06/16  Palliative Care Type  Return patient Palliative Care  Reason for referral  Pain, Non-pain Symptom, Clarify Goals of Care  Date of Admission  02/05/16  Date first seen by Palliative Care  02/07/16  # of days IP prior to Palliative referral  1  Clinical Assessment  Palliative Performance Scale Score  30%  Pain Max last 24 hours  6  Pain Min Last 24 hours  4  Dyspnea Max Last 24 Hours  4  Dyspnea Min Last 24 hours  3  Nausea Max Last 24 Hours  4  Nausea Min Last 24 Hours  3  Anxiety Max Last 24 Hours  5  Anxiety Min Last 24 Hours  4  Psychosocial & Spiritual Assessment  Palliative Care Outcomes  Patient/Family meeting held?  Yes  Who was at the meeting?  patient daughter   Palliative Care follow-up planned  Yes, Home      Patient Active Problem List   Diagnosis Date Noted  . Encounter for palliative care   . Abdominal pain 02/06/2016  . UTI (lower urinary tract infection) 02/06/2016  . Absolute anemia 02/06/2016  . Gastric adenocarcinoma ([place]) 02/06/2016  . Cancer related pain   . 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 In a.m. on 9-10: Discontinue basal rate, continue bolus doses of Dilaudid PCA and start OxyContin scheduled 20 mg every 8 hours or based on patient's total PCA requirements.  Continue current bowel regimen, try milk and molasses enema today  Goals of care discussions: Met with the patient and daughter Tiffany at the bedside this morning. Daughter recalled having conversations with oncology. CODE STATUS discussions undertaken. CODE STATUS now established as DO NOT RESUSCITATE/DO NOT INTUBATE. Patient wishes to have good control of her symptoms especially pain, wishes to be in a home environment, wishes to spend as much time with her grandchildren as possible. We discussed about how hospice services can help. Patient and daughter to think further. They do have questions for oncology with regards to immunotherapy.  Palliative care will continue to follow along, address symptoms and help guide appropriate decision-making. Discussed with Dr. Eliseo Squires this morning.   Code Status:    Code Status Orders        Start     Ordered   02/08/16 1058  Do not attempt resuscitation (DNR)  Continuous    Question Answer Comment  In the event of cardiac or respiratory ARREST Do not call a "code blue"   In the event of cardiac or respiratory ARREST Do not perform Intubation, CPR, defibrillation or ACLS   In the event of cardiac or respiratory ARREST Use medication by any route,  position, wound care, and other measures to relive pain and suffering. May use oxygen, suction and manual treatment of airway obstruction as needed  for comfort.      02/08/16 1059    Code Status History    Date Active Date Inactive Code Status Order ID Comments User Context   02/06/2016  6:21 AM 02/08/2016 10:59 AM Full Code [id number]  Rise Patience, MD ED       Prognosis:   < 3 months  Discharge Planning:  Home with Hospice is highly recommended, patient wishes to discuss further with daughter Tiffany   Care plan was discussed with  Patient, daughter, Dr. Eliseo Squires  Thank you for allowing the Palliative Medicine Team to assist in the care of this patient.   Time In:  9 Time Out: 935 Total Time 35 Prolonged Time Billed  no       Greater than 50%  of this time was spent counseling and coordinating care related to the above assessment and plan.  Loistine Chance, MD [phone]  Please contact Palliative Medicine Team phone at [phone] for questions and concerns.

## 2016-02-08 NOTE — Progress Notes (Signed)
Pharmacy Antibiotic Note  Selena Hayes is a 55 y.o. female admitted on 02/05/2016 with UTI.  Pharmacy has been consulted for levaquin dosing.  - s/p paracentesis on 9/8 - afeb, wbc wnl, scr ok (crcl~59) - day #3 of abx   Plan: - will change Levaquin to 250 mg IV q24h for UTI indication - Pharmacy will sign off  _____________________________  Height: 5\' 2"  (157.5 cm) Weight: 105 lb 2.6 oz (47.7 kg) IBW/kg (Calculated) : 50.1  Temp (24hrs), Avg:98.1 F (36.7 C), Min:97.8 F (36.6 C), Max:98.4 F (36.9 C)   Recent Labs Lab 02/06/16 0006 02/06/16 0027 02/07/16 0510  WBC 9.1  --  5.5  CREATININE 0.58 0.50 0.44    Estimated Creatinine Clearance: 59.1 mL/min (by C-G formula based on SCr of 0.8 mg/dL).    Allergies  Allergen Reactions  . Keflex [Cephalexin] Hives    Antimicrobials this admission: 9/7 levaquin >>   Microbiology results: 9/7 BCx x2: ngtd 9/7 UCx: neg FINAL 9/7 UA: large leukocytes  Thank you for allowing pharmacy to be a part of this patient's care.  Lynelle Doctor 02/08/2016 9:44 AM

## 2016-02-08 NOTE — Progress Notes (Signed)
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 Patient received received 3 cycles chemotherapy. She has been having abdominal pain since the surgery, it has getting worse daily, also developed 25 pounds weight loss in the past few months. -ED: patient is uncomfortable with severe abdominal pains. CT abd showed peritoneal carcinomatosis, ascites   Assessment/Plan:  Intractable abdominal pains in the setting of metastatic cancer, peritoneal carcinomatosis. Patient was on fentanyl patch+prn percocet prior to admission  -she reports better pain control with iv dilaudid PCA -s/p paracentesis  Metastatic gastric cancer with metastasis to peritoneal, s/p gastrectomy. Prognosis poor, appreciate oncology input. Consulted palliative care  UTI. Cont levofloxacin -no growth on culture but done after abx    Code Status: full Family Communication: d/w patient, RN and daughter Disposition Plan: home with hospice???   Consultants:  Palliative care   oncology  Procedures:  CT abd   Antibiotics:  Levofloxacin 9/7>>> (indicate start date, and stop date if known)  HPI/Subjective: Alert, reports abdominal pains. But improved with dilaudid. No SOB. No Chest pain.   Objective: Vitals:   02/08/16 1040 02/08/16 1120  BP:    Pulse:    Resp: 15 18  Temp:      Intake/Output Summary (Last 24 hours) at 02/08/16 1326 Last data filed at 02/08/16 0451  Gross per 24 hour  Intake           1790.72 ml  Output              350 ml  Net          1440.72 ml   Filed Weights   02/05/16 2344 02/06/16 0628  Weight: 41.7 kg (92 lb) 47.7 kg (105 lb 2.6 oz)    Exam:   General:  Alert, no distress   Cardiovascular: s1,s2 rrr  Respiratory: CTA BL   Abdomen: mild distended, mild tender   Musculoskeletal: no leg edema    Data Reviewed: Basic Metabolic Panel:  Recent Labs Lab 02/06/16 0006 02/06/16 0027 02/07/16 0510  NA 133* 132* 138  K 3.5 3.5 3.4*  CL 97* 95* 105  CO2 25  --  27  GLUCOSE 105* 101* 118*  BUN 12 10 9   CREATININE 0.58 0.50 0.44  CALCIUM 8.8*  --  8.6*   Liver Function Tests:  Recent Labs Lab 02/06/16 0006 02/07/16 0510  AST 15 15  ALT 17 13*  ALKPHOS 75 60  BILITOT 1.0 0.4  PROT 6.6 5.8*  ALBUMIN 2.7* 2.3*    Recent Labs Lab 02/06/16 0006  LIPASE 15   No results for input(s): AMMONIA in the last 168 hours. CBC:  Recent Labs Lab 02/06/16 0006 02/06/16 0027 02/07/16 0510  WBC 9.1  --  5.5  NEUTROABS 6.7  --   --   HGB 7.7* 11.9* 10.0*  HCT 23.0* 35.0* 30.1*  MCV 89.1  --  89.6  PLT 416*  --  318   Cardiac Enzymes: No results for input(s): CKTOTAL, CKMB, CKMBINDEX, TROPONINI in the last 168 hours. BNP (last 3  results) No results for input(s): BNP in the last 8760 hours.  ProBNP (last 3 results) No results for input(s): PROBNP in the last 8760 hours.  CBG:  Recent Labs Lab 02/07/16 0828 02/07/16 1628 02/07/16 2003 02/08/16 0353 02/08/16 1219  GLUCAP 112* 119* 104* 117* 152*    Recent Results (from the past 240 hour(s))  Blood culture (routine x 2)     Status: None (Preliminary result)   Collection Time: 02/06/16 12:06 AM  Result Value Ref Range Status   Specimen Description BLOOD PORT  Final   Special Requests BOTTLES DRAWN AEROBIC AND ANAEROBIC 5ML EA  Final   Culture   Final    NO GROWTH 2 DAYS Performed at [facility]    Report Status PENDING  Incomplete  Blood culture (routine x 2)      Status: None (Preliminary result)   Collection Time: 02/06/16 12:38 AM  Result Value Ref Range Status   Specimen Description BLOOD BLOOD RIGHT FOREARM  Final   Special Requests BOTTLES DRAWN AEROBIC AND ANAEROBIC 5ML EA  Final   Culture   Final    NO GROWTH 2 DAYS Performed at [facility]    Report Status PENDING  Incomplete  Culture, Urine     Status: None   Collection Time: 02/06/16  2:40 AM  Result Value Ref Range Status   Specimen Description URINE, RANDOM  Final   Special Requests NONE  Final   Culture NO GROWTH Performed at [facility]   Final   Report Status 02/08/2016 FINAL  Final  Culture, body fluid-bottle     Status: None (Preliminary result)   Collection Time: 02/07/16  3:12 PM  Result Value Ref Range Status   Specimen Description PERITONEAL  Final   Special Requests NONE  Final   Culture NO GROWTH < 24 HOURS  Final   Report Status PENDING  Incomplete  Gram stain     Status: None   Collection Time: 02/07/16  3:12 PM  Result Value Ref Range Status   Specimen Description PERITONEAL  Final   Special Requests NONE  Final   Gram Stain   Final    WBC PRESENT, PREDOMINANTLY MONONUCLEAR NO ORGANISMS SEEN CYTOSPIN SMEAR    Report Status 02/07/2016 FINAL  Final     Studies: US Paracentesis  Result Date: 02/07/2016 INDICATION: Metastatic gastric cancer, ascites. Request made for diagnostic and therapeutic paracentesis. EXAM: ULTRASOUND GUIDED DIAGNOSTIC AND THERAPEUTIC PARACENTESIS MEDICATIONS: None. COMPLICATIONS: None immediate. PROCEDURE: Informed written consent was obtained from the patient after a discussion of the risks, benefits and alternatives to treatment. A timeout was performed prior to the initiation of the procedure. Initial ultrasound scanning demonstrates a small to moderate amount of ascites within the right lower abdominal quadrant. The right lower abdomen was prepped and draped in the usual sterile fashion. 1% lidocaine was used for local  anesthesia. Following this, a Yueh catheter was introduced. An ultrasound image was saved for documentation purposes. The paracentesis was performed. The catheter was removed and a dressing was applied. The patient tolerated the procedure well without immediate post procedural complication. FINDINGS: A total of approximately 2.8 liters of yellow fluid was removed. Samples were sent to the laboratory as requested by the clinical team. IMPRESSION: Successful ultrasound-guided diagnostic and therapeutic paracentesis yielding 2.8 liters of peritoneal fluid. Read by: Rowe Robert, PA-C Electronically Signed   By: Jacqulynn Cadet M.D.   On: 02/07/2016 16:13    Scheduled Meds: . feeding supplement  1  Container Oral TID BM  . HYDROmorphone   Intravenous Q4H  . lactulose  20 g Oral TID  . [START ON 02/09/2016] levofloxacin (LEVAQUIN) IV  250 mg Intravenous Q24H  . milk and molasses  1 enema Rectal Once  . nicotine  14 mg Transdermal Daily  . polyethylene glycol  17 g Oral BID  . senna  2 tablet Oral QHS   Continuous Infusions:   Principal Problem:   Abdominal pain Active Problems:   UTI (lower urinary tract infection)   Absolute anemia   Gastric adenocarcinoma ([place])   Cancer related pain   Constipation   Encounter for palliative care   Goals of care, counseling/discussion   DNR (do not resuscitate)    Time spent: 25 minutes     [facility] Pager [phone]. If 7PM-7AM, please contact night-coverage at [email or web address], password [facility] 02/08/2016, 1:26 PM  LOS: 2 days

## 2016-02-09 LAB — BASIC METABOLIC PANEL
ANION GAP: 6 (ref 5–15)
BUN: 6 mg/dL (ref 6–20)
CHLORIDE: 109 mmol/L (ref 101–111)
CO2: 25 mmol/L (ref 22–32)
Calcium: 7.6 mg/dL — ABNORMAL LOW (ref 8.9–10.3)
Creatinine, Ser: 0.89 mg/dL (ref 0.44–1.00)
GFR calc Af Amer: >60 mL/min (ref >60)
Glucose, Bld: 282 mg/dL — ABNORMAL HIGH (ref 65–99)
POTASSIUM: 3.1 mmol/L — AB (ref 3.5–5.1)
SODIUM: 140 mmol/L (ref 135–145)

## 2016-02-09 LAB — GLUCOSE, CAPILLARY
GLUCOSE-CAPILLARY: 123 mg/dL — AB (ref 65–99)
Glucose-Capillary: 106 mg/dL — ABNORMAL HIGH (ref 65–99)
Glucose-Capillary: 108 mg/dL — ABNORMAL HIGH (ref 65–99)

## 2016-02-09 LAB — CBC
HCT: 30.7 % — ABNORMAL LOW (ref 36.0–46.0)
HEMOGLOBIN: 10.2 g/dL — AB (ref 12.0–15.0)
MCH: 29.8 pg (ref 26.0–34.0)
MCHC: 33.2 g/dL (ref 30.0–36.0)
MCV: 89.8 fL (ref 78.0–100.0)
Platelets: 277 10*3/uL (ref 150–400)
RBC: 3.42 MIL/uL — AB (ref 3.87–5.11)
RDW: 14.1 % (ref 11.5–15.5)
WBC: 5.8 10*3/uL (ref 4.0–10.5)

## 2016-02-09 LAB — MAGNESIUM: Magnesium: 1.3 mg/dL — ABNORMAL LOW (ref 1.7–2.4)

## 2016-02-09 MED ORDER — MAGNESIUM SULFATE 2 GM/50ML IV SOLN
2.0000 g | Freq: Once | INTRAVENOUS | Status: AC
  Administered 2016-02-09: 2 g via INTRAVENOUS
  Filled 2016-02-09: qty 50

## 2016-02-09 MED ORDER — POTASSIUM CHLORIDE 10 MEQ/100ML IV SOLN
10.0000 meq | INTRAVENOUS | Status: AC
  Administered 2016-02-09 (×4): 10 meq via INTRAVENOUS
  Filled 2016-02-09 (×4): qty 100

## 2016-02-09 MED ORDER — HYDROMORPHONE 1 MG/ML IV SOLN
INTRAVENOUS | Status: DC
  Administered 2016-02-09: 10.24 mg via INTRAVENOUS
  Administered 2016-02-09: 8.19 mg via INTRAVENOUS
  Administered 2016-02-09: 8.56 mg via INTRAVENOUS
  Administered 2016-02-10: 11.6 mg via INTRAVENOUS
  Administered 2016-02-10: 25 mg via INTRAVENOUS
  Administered 2016-02-10: 11.94 mg via INTRAVENOUS
  Filled 2016-02-09 (×2): qty 25

## 2016-02-09 MED ORDER — GI COCKTAIL ~~LOC~~
30.0000 mL | Freq: Once | ORAL | Status: AC
  Administered 2016-02-09: 30 mL via ORAL
  Filled 2016-02-09: qty 30

## 2016-02-09 MED ORDER — LEVOFLOXACIN 500 MG PO TABS: 250.0000 mg | ORAL_TABLET | ORAL | Status: DC

## 2016-02-09 NOTE — Progress Notes (Signed)
PT Cancellation Note  Patient Details Name: Selena Hayes MRN: [id number] DOB: 1960-06-07   Cancelled Treatment:    Reason Eval/Treat Not Completed: Pain limiting ability to participate. Pt politely declined participation on today. She was agreeable to PT checking back on tomorrow.   Weston Anna, MPT Pager: [phone]

## 2016-02-09 NOTE — Progress Notes (Signed)
Daily Progress Note   Patient Name: Selena Hayes       Date: 02/09/2016 DOB: 1961-03-28  Age: 55 y.o. MRN#: [id number] Attending Physician: Geradine Girt, DO Primary Care Physician: No PCP Per Patient Admit Date: 02/05/2016  Reason for Consultation/Follow-up: Establishing goals of care  Life limiting illness of metastatic gastric cancer Subjective:  Patient did have a bowel movement this morning, however, states that her pain is uncontrolled.  She is using a lot of bolus dosages for rescue Will increase basal rate on Dilaudid PCA from 0.3 to 0.5 mg/hour and monitor.  Patient still nauseated, just had a large BM, continue to monitor.  Not ready to come off PCA just yet. Will re assess in am on 02-10-16 Brother visiting from [place] is now at the bedside.  Patient with flat affect this morning, does not interact much today  Length of Stay: 3  Current Medications: Scheduled Meds:  . feeding supplement  1 Container Oral TID BM  . HYDROmorphone   Intravenous Q4H  . lactulose  20 g Oral TID  . [START ON 02/10/2016] levofloxacin  250 mg Oral Q24H  . milk and molasses  1 enema Rectal Once  . nicotine  14 mg Transdermal Daily  . polyethylene glycol  17 g Oral BID  . potassium chloride  10 mEq Intravenous Q1 Hr x 4  . senna  2 tablet Oral QHS    Continuous Infusions:    PRN Meds: acetaminophen **OR** acetaminophen, bisacodyl, diphenhydrAMINE **OR** diphenhydrAMINE, HYDROmorphone (DILAUDID) injection, iopamidol, milk and molasses, naloxone **AND** sodium chloride flush, ondansetron (ZOFRAN) IV, ondansetron **OR** [DISCONTINUED] ondansetron (ZOFRAN) IV, promethazine, sodium chloride flush  Physical Exam         Weak frail elderly lady resting in bed S1-S2 Clear breath sounds  anteriorly Abdomen firm, less distended-patient went for paracenteses on 9-8  Extremities warm to touch no edema Awake alert oriented  Vital Signs: BP (!) 130/95 (BP Location: Right Arm)   Pulse 100   Temp 98.2 F (36.8 C) (Oral)   Resp 17   Ht 5\' 2"  (1.575 m)   Wt 47.7 kg (105 lb 2.6 oz)   SpO2 93%   BMI 19.23 kg/m  SpO2: SpO2: 93 % O2 Device: O2 Device: Not Delivered O2 Flow Rate: O2 Flow Rate (L/min): 16 L/min  Intake/output  summary:   Intake/Output Summary (Last 24 hours) at 02/09/16 1041 Last data filed at 02/08/16 1842  Gross per 24 hour  Intake              990 ml  Output                0 ml  Net              990 ml   LBM: Last BM Date:  (per pt a month ago) Baseline Weight: Weight: 41.7 kg (92 lb) Most recent weight: Weight: 47.7 kg (105 lb 2.6 oz)       Palliative Assessment/Data:    Flowsheet Rows   Flowsheet Row Most Recent Value  Intake Tab  Referral Department  Hospitalist  Unit at Time of Referral  Oncology Unit  Palliative Care Primary Diagnosis  Cancer  Date Notified  02/06/16  Palliative Care Type  Return patient Palliative Care  Reason for referral  Pain, Non-pain Symptom, Clarify Goals of Care  Date of Admission  02/05/16  Date first seen by Palliative Care  02/07/16  # of days IP prior to Palliative referral  1  Clinical Assessment  Palliative Performance Scale Score  30%  Pain Max last 24 hours  6  Pain Min Last 24 hours  4  Dyspnea Max Last 24 Hours  4  Dyspnea Min Last 24 hours  3  Nausea Max Last 24 Hours  4  Nausea Min Last 24 Hours  3  Anxiety Max Last 24 Hours  5  Anxiety Min Last 24 Hours  4  Psychosocial & Spiritual Assessment  Palliative Care Outcomes  Patient/Family meeting held?  Yes  Who was at the meeting?  patient daughter   Palliative Care follow-up planned  Yes, Home      Patient Active Problem List   Diagnosis Date Noted  . Goals of care, counseling/discussion   . DNR (do not resuscitate)   . Ascites   .  Encounter for palliative care   . Abdominal pain 02/06/2016  . UTI (lower urinary tract infection) 02/06/2016  . Absolute anemia 02/06/2016  . Gastric adenocarcinoma ([place]) 02/06/2016  . Cancer related pain   . 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 Discussed with both patient and bedside RN Caren Griffins present at the bedside.   Continue current bowel regimen,    Goals of care discussions: established DNR DNI on 02-08-16. Ongoing discussions with patient and daughter regarding home with hospice on D/C. They do have questions for oncology with regards to immunotherapy.  Palliative care will continue to follow along, address symptoms and help guide appropriate decision-making. Discussed with Dr. Eliseo Squires on 02-08-16   Code Status:    Code Status Orders        Start     Ordered   02/08/16 1058  Do not attempt resuscitation (DNR)  Continuous    Question Answer Comment  In the event of cardiac or  respiratory ARREST Do not call a "code blue"   In the event of cardiac or respiratory ARREST Do not perform Intubation, CPR, defibrillation or ACLS   In the event of cardiac or respiratory ARREST Use medication by any route, position, wound care, and other measures to relive pain and suffering. May use oxygen, suction and manual treatment of airway obstruction  as needed for comfort.      02/08/16 1059    Code Status History    Date Active Date Inactive Code Status Order ID Comments User Context   02/06/2016  6:21 AM 02/08/2016 10:59 AM Full Code [id number]  Rise Patience, MD ED       Prognosis:   weeks to months.   Discharge Planning:  Home with Hospice is highly recommended, patient wishes to discuss further with daughter Selena Hayes   Care plan was discussed with  Patient, daughter, Dr. Eliseo Squires  Thank you for allowing the Palliative Medicine Team to assist in the care of this patient.   Time In:  9 Time Out: 935 Total Time 35 Prolonged Time Billed  no       Greater than 50%  of this time was spent counseling and coordinating care related to the above assessment and plan.  Loistine Chance, MD [phone]  Please contact Palliative Medicine Team phone at [phone] for questions and concerns.

## 2016-02-09 NOTE — Progress Notes (Signed)
PHARMACIST - PHYSICIAN COMMUNICATION DR:   Eliseo Squires CONCERNING: Antibiotic IV to Oral Route Change Policy  RECOMMENDATION: This patient is receiving levaquin by the intravenous route.  Based on criteria approved by the Pharmacy and Therapeutics Committee, the antibiotic(s) is/are being converted to the equivalent oral dose form(s).   DESCRIPTION: These criteria include:  Patient being treated for a respiratory tract infection, urinary tract infection, cellulitis or clostridium difficile associated diarrhea if on metronidazole  The patient is not neutropenic and does not exhibit a GI malabsorption state  The patient is eating (either orally or via tube) and/or has been taking other orally administered medications for a least 24 hours  The patient is improving clinically and has a Tmax < 100.5  If you have questions about this conversion, please contact the Pharmacy Department  []   [phone] )  [facility] []   [phone] )  [facility] []   [phone] )  [facility] []   [phone] )  [facility] [x]   [phone] )  [place], PharmD, BCPS 02/09/2016 9:38 AM

## 2016-02-09 NOTE — Progress Notes (Signed)
[facility] PROGRESS NOTE  Selena Hayes [id number] DOB: 1961/05/16 DOA: 02/05/2016 PCP: No PCP Per Patient  Principal Problem:   Abdominal pain Active Problems:   UTI (lower urinary tract infection)   Absolute anemia   Gastric cancer (HCC)    Brief summary  55 y.o.femalewith PMH of metastatic gastric cancer with metastasis to peritoneal, s/p gastrectomy, complicated intraabdominal abscess, who was brought to the ER by patient's daughter from [place] after patient was in the ER at [facility] for abdominal pain. Patient received received 3 cycles chemotherapy. She has been having abdominal pain since the surgery, it has getting worse daily, also developed 25 pounds weight loss in the past few months. -ED: patient is uncomfortable with severe abdominal pains. CT abd showed peritoneal carcinomatosis, ascites   Assessment/Plan:  Intractable abdominal pains in the setting of metastatic cancer, peritoneal carcinomatosis. Patient was on fentanyl patch+prn percocet prior to admission  -she reports better pain control with iv dilaudid PCA- defer to palliative care -s/p paracentesis  Metastatic gastric cancer with metastasis to peritoneal, s/p gastrectomy. Prognosis poor, appreciate oncology input. Consulted palliative care  UTI. Cont levofloxacin -no growth on culture but done after abx -d/c since had 3 days of treatment  Hypokalemia/hypomagesium -replace   Code Status: full Family Communication: d/w patient, RN and brother Disposition Plan: home with hospice???   Consultants:  Palliative care   oncology  Procedures:  CT abd   Antibiotics:  Levofloxacin 9/7>>>9/10  HPI/Subjective: Had BM only able to take in small amounts of PO food  Objective: Vitals:   02/09/16 1028 02/09/16 1320  BP:  128/87  Pulse:  96  Resp: 17 16  Temp:  98.3 F (36.8 C)    Intake/Output Summary (Last 24 hours) at 02/09/16 1411 Last data filed at  02/08/16 1842  Gross per 24 hour  Intake              640 ml  Output                0 ml  Net              640 ml   Filed Weights   02/05/16 2344 02/06/16 0628  Weight: 41.7 kg (92 lb) 47.7 kg (105 lb 2.6 oz)    Exam:   General:  Alert, no distress   Cardiovascular: s1,s2 rrr  Respiratory: CTA BL   Abdomen: mild distended, mild tender   Musculoskeletal: no leg edema    Data Reviewed: Basic Metabolic Panel:  Recent Labs Lab 02/06/16 0006 02/06/16 0027 02/07/16 0510 02/09/16 0410 02/09/16 0412  NA 133* 132* 138  --  140  K 3.5 3.5 3.4*  --  3.1*  CL 97* 95* 105  --  109  CO2 25  --  27  --  25  GLUCOSE 105* 101* 118*  --  282*  BUN 12 10 9   --  6  CREATININE 0.58 0.50 0.44  --  0.89  CALCIUM 8.8*  --  8.6*  --  7.6*  MG  --   --   --  1.3*  --    Liver Function Tests:  Recent Labs Lab 02/06/16 0006 02/07/16 0510  AST 15 15  ALT 17 13*  ALKPHOS 75 60  BILITOT 1.0 0.4  PROT 6.6 5.8*  ALBUMIN 2.7* 2.3*    Recent Labs Lab 02/06/16 0006  LIPASE 15   No results for input(s): AMMONIA in the last 168 hours. CBC:  Recent Labs  Lab 02/06/16 0006 02/06/16 0027 02/07/16 0510 02/09/16 0412  WBC 9.1  --  5.5 5.8  NEUTROABS 6.7  --   --   --   HGB 7.7* 11.9* 10.0* 10.2*  HCT 23.0* 35.0* 30.1* 30.7*  MCV 89.1  --  89.6 89.8  PLT 416*  --  318 277   Cardiac Enzymes: No results for input(s): CKTOTAL, CKMB, CKMBINDEX, TROPONINI in the last 168 hours. BNP (last 3 results) No results for input(s): BNP in the last 8760 hours.  ProBNP (last 3 results) No results for input(s): PROBNP in the last 8760 hours.  CBG:  Recent Labs Lab 02/08/16 0353 02/08/16 1219 02/08/16 1821 02/08/16 2325 02/09/16 0740  GLUCAP 117* 152* 120* 132* 106*    Recent Results (from the past 240 hour(s))  Blood culture (routine x 2)     Status: None (Preliminary result)   Collection Time: 02/06/16 12:06 AM  Result Value Ref Range Status   Specimen Description BLOOD PORT   Final   Special Requests BOTTLES DRAWN AEROBIC AND ANAEROBIC 5ML EA  Final   Culture   Final    NO GROWTH 2 DAYS Performed at [facility]    Report Status PENDING  Incomplete  Blood culture (routine x 2)     Status: None (Preliminary result)   Collection Time: 02/06/16 12:38 AM  Result Value Ref Range Status   Specimen Description BLOOD BLOOD RIGHT FOREARM  Final   Special Requests BOTTLES DRAWN AEROBIC AND ANAEROBIC 5ML EA  Final   Culture   Final    NO GROWTH 2 DAYS Performed at [facility]    Report Status PENDING  Incomplete  Culture, Urine     Status: None   Collection Time: 02/06/16  2:40 AM  Result Value Ref Range Status   Specimen Description URINE, RANDOM  Final   Special Requests NONE  Final   Culture NO GROWTH Performed at [facility]   Final   Report Status 02/08/2016 FINAL  Final  Culture, body fluid-bottle     Status: None (Preliminary result)   Collection Time: 02/07/16  3:12 PM  Result Value Ref Range Status   Specimen Description PERITONEAL  Final   Special Requests NONE  Final   Culture NO GROWTH < 24 HOURS  Final   Report Status PENDING  Incomplete  Gram stain     Status: None   Collection Time: 02/07/16  3:12 PM  Result Value Ref Range Status   Specimen Description PERITONEAL  Final   Special Requests NONE  Final   Gram Stain   Final    WBC PRESENT, PREDOMINANTLY MONONUCLEAR NO ORGANISMS SEEN CYTOSPIN SMEAR    Report Status 02/07/2016 FINAL  Final     Studies: US Paracentesis  Result Date: 02/07/2016 INDICATION: Metastatic gastric cancer, ascites. Request made for diagnostic and therapeutic paracentesis. EXAM: ULTRASOUND GUIDED DIAGNOSTIC AND THERAPEUTIC PARACENTESIS MEDICATIONS: None. COMPLICATIONS: None immediate. PROCEDURE: Informed written consent was obtained from the patient after a discussion of the risks, benefits and alternatives to treatment. A timeout was performed prior to the initiation of the procedure. Initial  ultrasound scanning demonstrates a small to moderate amount of ascites within the right lower abdominal quadrant. The right lower abdomen was prepped and draped in the usual sterile fashion. 1% lidocaine was used for local anesthesia. Following this, a Yueh catheter was introduced. An ultrasound image was saved for documentation purposes. The paracentesis was performed. The catheter was removed and a dressing was applied.  The patient tolerated the procedure well without immediate post procedural complication. FINDINGS: A total of approximately 2.8 liters of yellow fluid was removed. Samples were sent to the laboratory as requested by the clinical team. IMPRESSION: Successful ultrasound-guided diagnostic and therapeutic paracentesis yielding 2.8 liters of peritoneal fluid. Read by: Rowe Robert, PA-C Electronically Signed   By: Jacqulynn Cadet M.D.   On: 02/07/2016 16:13    Scheduled Meds: . feeding supplement  1 Container Oral TID BM  . HYDROmorphone   Intravenous Q4H  . lactulose  20 g Oral TID  . [START ON 02/10/2016] levofloxacin  250 mg Oral Q24H  . milk and molasses  1 enema Rectal Once  . nicotine  14 mg Transdermal Daily  . polyethylene glycol  17 g Oral BID  . senna  2 tablet Oral QHS   Continuous Infusions:   Principal Problem:   Abdominal pain Active Problems:   UTI (lower urinary tract infection)   Absolute anemia   Gastric adenocarcinoma ([place])   Cancer related pain   Constipation   Encounter for palliative care   Goals of care, counseling/discussion   DNR (do not resuscitate)   Ascites    Time spent: 25 minutes     [place] [facility] Pager [phone]. If 7PM-7AM, please contact night-coverage at [email or web address], password [facility] 02/09/2016, 2:11 PM  LOS: 3 days

## 2016-02-10 DIAGNOSIS — C169 Malignant neoplasm of stomach, unspecified: Secondary | ICD-10-CM

## 2016-02-10 DIAGNOSIS — C799 Secondary malignant neoplasm of unspecified site: Secondary | ICD-10-CM

## 2016-02-10 LAB — BASIC METABOLIC PANEL
Anion gap: 8 (ref 5–15)
BUN: 9 mg/dL (ref 6–20)
CALCIUM: 8.5 mg/dL — AB (ref 8.9–10.3)
CO2: 27 mmol/L (ref 22–32)
CREATININE: 0.47 mg/dL (ref 0.44–1.00)
Chloride: 101 mmol/L (ref 101–111)
GFR calc non Af Amer: >60 mL/min (ref >60)
Glucose, Bld: 123 mg/dL — ABNORMAL HIGH (ref 65–99)
Potassium: 4 mmol/L (ref 3.5–5.1)
SODIUM: 136 mmol/L (ref 135–145)

## 2016-02-10 LAB — GLUCOSE, CAPILLARY
GLUCOSE-CAPILLARY: 111 mg/dL — AB (ref 65–99)
GLUCOSE-CAPILLARY: 117 mg/dL — AB (ref 65–99)
GLUCOSE-CAPILLARY: 102 mg/dL — AB (ref 65–99)

## 2016-02-10 LAB — MAGNESIUM: MAGNESIUM: 1.9 mg/dL (ref 1.7–2.4)

## 2016-02-10 MED ORDER — OXYCODONE HCL ER 40 MG PO T12A
80.0000 mg | EXTENDED_RELEASE_TABLET | Freq: Three times a day (TID) | ORAL | Status: DC
  Administered 2016-02-10 (×2): 80 mg via ORAL
  Filled 2016-02-10 (×2): qty 2

## 2016-02-10 MED ORDER — POLYETHYLENE GLYCOL 3350 17 G PO PACK
17.0000 g | PACK | Freq: Every day | ORAL | Status: DC
  Administered 2016-02-11 – 2016-02-13 (×2): 17 g via ORAL
  Filled 2016-02-10 (×2): qty 1

## 2016-02-10 MED ORDER — HYDROMORPHONE 1 MG/ML IV SOLN
INTRAVENOUS | Status: DC
  Administered 2016-02-10: 11.6 mg via INTRAVENOUS
  Administered 2016-02-10: 25 mg via INTRAVENOUS
  Administered 2016-02-10: 12.8 mg via INTRAVENOUS
  Administered 2016-02-10: 10.39 mg via INTRAVENOUS
  Administered 2016-02-10 – 2016-02-11 (×2): via INTRAVENOUS
  Administered 2016-02-11: 6.4 mg via INTRAVENOUS
  Administered 2016-02-11: 12.8 mg via INTRAVENOUS
  Administered 2016-02-11: 06:00:00 via INTRAVENOUS
  Administered 2016-02-11: 10.8 mg via INTRAVENOUS
  Administered 2016-02-11: 11.6 mg via INTRAVENOUS
  Filled 2016-02-10 (×4): qty 25

## 2016-02-10 MED ORDER — PANTOPRAZOLE SODIUM 40 MG PO TBEC
40.0000 mg | DELAYED_RELEASE_TABLET | Freq: Two times a day (BID) | ORAL | Status: DC
  Administered 2016-02-10 – 2016-02-15 (×8): 40 mg via ORAL
  Filled 2016-02-10 (×8): qty 1

## 2016-02-10 NOTE — Progress Notes (Signed)
Selena Hayes   DOB:[birth year]   RP#:[id number]   WKM#:[id number]  Subjective: Pt is on dilaudid PCA, has had good bowel movement, feels better overall, able to keep some food down. She tolerated paracentesis well last Friday.    Objective:  Vitals:   02/10/16 1737 02/10/16 2018  BP:    Pulse:    Resp: 20 16  Temp:      Body mass index is 19.23 kg/m.  Intake/Output Summary (Last 24 hours) at 02/10/16 2132 Last data filed at 02/10/16 1400  Gross per 24 hour  Intake              840 ml  Output              540 ml  Net              300 ml     Sclerae unicteric  Oropharynx clear  No peripheral adenopathy  Lungs clear -- no rales or rhonchi  Heart regular rate and rhythm  Abdomen diffuse mild tenderness, less ascites   MSK no focal spinal tenderness, no peripheral edema  Neuro nonfocal    CBG (last 3)   Recent Labs  02/09/16 2002 02/10/16 0801 02/10/16 1620  GLUCAP 123* 111* 117*     Labs:  Lab Results  Component Value Date   WBC 5.8 02/09/2016   HGB 10.2 (L) 02/09/2016   HCT 30.7 (L) 02/09/2016   MCV 89.8 02/09/2016   PLT 277 02/09/2016   NEUTROABS 6.7 02/06/2016    @LASTCHEMISTRY @  Urine Studies No results for input(s): UHGB, CRYS in the last 72 hours.  Invalid input(s): UACOL, UAPR, USPG, UPH, UTP, UGL, [place], UBIL, UNIT, UROB, [place], UEPI, UWBC, [place], [place], [place], [place], [place]  Basic Metabolic Panel:  Recent Labs Lab 02/06/16 0006 02/06/16 0027 02/07/16 0510 02/09/16 0410 02/09/16 0412 02/10/16 0415  NA 133* 132* 138  --  140 136  K 3.5 3.5 3.4*  --  3.1* 4.0  CL 97* 95* 105  --  109 101  CO2 25  --  27  --  25 27  GLUCOSE 105* 101* 118*  --  282* 123*  BUN 12 10 9   --  6 9  CREATININE 0.58 0.50 0.44  --  0.89 0.47  CALCIUM 8.8*  --  8.6*  --  7.6* 8.5*  MG  --   --   --  1.3*  --  1.9   GFR Estimated Creatinine Clearance: 59.1 mL/min (by C-G formula based on SCr of 0.8 mg/dL). Liver Function Tests:  Recent Labs Lab 02/06/16 0006  02/07/16 0510  AST 15 15  ALT 17 13*  ALKPHOS 75 60  BILITOT 1.0 0.4  PROT 6.6 5.8*  ALBUMIN 2.7* 2.3*    Recent Labs Lab 02/06/16 0006  LIPASE 15   No results for input(s): AMMONIA in the last 168 hours. Coagulation profile No results for input(s): INR, PROTIME in the last 168 hours.  CBC:  Recent Labs Lab 02/06/16 0006 02/06/16 0027 02/07/16 0510 02/09/16 0412  WBC 9.1  --  5.5 5.8  NEUTROABS 6.7  --   --   --   HGB 7.7* 11.9* 10.0* 10.2*  HCT 23.0* 35.0* 30.1* 30.7*  MCV 89.1  --  89.6 89.8  PLT 416*  --  318 277   Cardiac Enzymes: No results for input(s): CKTOTAL, CKMB, CKMBINDEX, TROPONINI in the last 168 hours. BNP: Invalid input(s): POCBNP CBG:  Recent Labs Lab 02/09/16 0740 02/09/16 1620 02/09/16  2002 02/10/16 0801 02/10/16 1620  GLUCAP 106* 108* 123* 111* 117*   D-Dimer No results for input(s): DDIMER in the last 72 hours. Hgb A1c No results for input(s): HGBA1C in the last 72 hours. Lipid Profile No results for input(s): CHOL, HDL, LDLCALC, TRIG, CHOLHDL, LDLDIRECT in the last 72 hours. Thyroid function studies No results for input(s): TSH, T4TOTAL, T3FREE, THYROIDAB in the last 72 hours.  Invalid input(s): FREET3 Anemia work up No results for input(s): VITAMINB12, FOLATE, FERRITIN, TIBC, IRON, RETICCTPCT in the last 72 hours. Microbiology Recent Results (from the past 240 hour(s))  Blood culture (routine x 2)     Status: None (Preliminary result)   Collection Time: 02/06/16 12:06 AM  Result Value Ref Range Status   Specimen Description BLOOD PORT  Final   Special Requests BOTTLES DRAWN AEROBIC AND ANAEROBIC 5ML EA  Final   Culture   Final    NO GROWTH 4 DAYS Performed at [facility]    Report Status PENDING  Incomplete  Blood culture (routine x 2)     Status: None (Preliminary result)   Collection Time: 02/06/16 12:38 AM  Result Value Ref Range Status   Specimen Description BLOOD BLOOD RIGHT FOREARM  Final   Special  Requests BOTTLES DRAWN AEROBIC AND ANAEROBIC 5ML EA  Final   Culture   Final    NO GROWTH 4 DAYS Performed at [facility]    Report Status PENDING  Incomplete  Culture, Urine     Status: None   Collection Time: 02/06/16  2:40 AM  Result Value Ref Range Status   Specimen Description URINE, RANDOM  Final   Special Requests NONE  Final   Culture NO GROWTH Performed at [facility]   Final   Report Status 02/08/2016 FINAL  Final  Culture, body fluid-bottle     Status: None (Preliminary result)   Collection Time: 02/07/16  3:12 PM  Result Value Ref Range Status   Specimen Description PERITONEAL  Final   Special Requests NONE  Final   Culture NO GROWTH 3 DAYS  Final   Report Status PENDING  Incomplete  Gram stain     Status: None   Collection Time: 02/07/16  3:12 PM  Result Value Ref Range Status   Specimen Description PERITONEAL  Final   Special Requests NONE  Final   Gram Stain   Final    WBC PRESENT, PREDOMINANTLY MONONUCLEAR NO ORGANISMS SEEN CYTOSPIN SMEAR    Report Status 02/07/2016 FINAL  Final      Studies:  No results found.  Assessment:  55 year old Caucasian female, who was diagnosed with T3N0M0 stage IIIA gastric body adenocarcinoma, status post neoadjuvant chemotherapy, followed by partial gastrectomy. Unfortunately she was found to have peritoneal metastasis during the surgery. She now presents worsening abdominal pain, nausea, anorexia, weight loss and constipation.  1. Intractable abdominal pain, likely secondary to the peritoneal carcinomatosis, ascites culture (-)  2. Moderate ascites, cytology (+) malignant cells  3. Metastatic gastric adenocarcinoma with metastasis to peritoneum 4. UTI 5. Anemia in new positive disease 6. Weight loss and severe malnutrition 7. Anorexia, constipation, and nausea, improving   Recommendations: - Appreciate palliative care and symptom management  -cytology came back positive after I saw her today, will  discuss with her tomorrow -I will request MSI test on her cytology block if adequate cells  -I spoke with her daughter last Friday, discussed overall very poor prognosis and not a candidate for chemo if her PS does  not improve -Hospice appropriate, will see if she agrees. She may want to wait and see.   Will follow up.    Truitt Merle, MD 02/10/2016  9:32 PM

## 2016-02-10 NOTE — Progress Notes (Signed)
Daily Progress Note   Patient Name: Selena Hayes       Date: 02/10/2016 DOB: January 08, 1961  Age: 55 y.o. MRN#: [id number] Attending Physician: Geradine Girt, DO Primary Care Physician: No PCP Per Patient Admit Date: 02/05/2016  Reason for Consultation/Follow-up: Establishing goals of care  Life limiting illness of metastatic gastric cancer Subjective: Physical and emotional pain, patient is emotionally labile this am, believes she is being left alone by hospital staff, then becomes tearful and states that she knows she is receiving good care here. Having good bowel movements. Daughter not at bedside currently Patient asking about immunotherapy See below:  Length of Stay: 4  Current Medications: Scheduled Meds:  . feeding supplement  1 Container Oral TID BM  . HYDROmorphone   Intravenous Q4H  . lactulose  20 g Oral TID  . milk and molasses  1 enema Rectal Once  . nicotine  14 mg Transdermal Daily  . oxyCODONE  80 mg Oral Q8H  . pantoprazole  40 mg Oral BID  . polyethylene glycol  17 g Oral Daily  . senna  2 tablet Oral QHS    Continuous Infusions:    PRN Meds: acetaminophen **OR** acetaminophen, bisacodyl, diphenhydrAMINE **OR** diphenhydrAMINE, HYDROmorphone (DILAUDID) injection, iopamidol, milk and molasses, naloxone **AND** sodium chloride flush, ondansetron (ZOFRAN) IV, ondansetron **OR** [DISCONTINUED] ondansetron (ZOFRAN) IV, promethazine, sodium chloride flush  Physical Exam         Weak frail elderly lady resting in bed S1-S2 Clear breath sounds anteriorly Abdomen firm, less distended-patient went for paracenteses on 9-8  Extremities warm to touch no edema Awake alert oriented  Vital Signs: BP (!) 132/92 (BP Location: Right Arm) Comment: RN Notified  Pulse (!) 102  Comment: RN Notified  Temp 98.5 F (36.9 C) (Oral)   Resp (!) 22   Ht 5\' 2"  (1.575 m)   Wt 47.7 kg (105 lb 2.6 oz)   SpO2 94%   BMI 19.23 kg/m  SpO2: SpO2: 94 % O2 Device: O2 Device: Not Delivered O2 Flow Rate: O2 Flow Rate (L/min): 16 L/min  Intake/output summary:   Intake/Output Summary (Last 24 hours) at 02/10/16 1143 Last data filed at 02/10/16 0536  Gross per 24 hour  Intake              600 ml  Output  940 ml  Net             -340 ml   LBM: Last BM Date: 02/10/16 Baseline Weight: Weight: 41.7 kg (92 lb) Most recent weight: Weight: 47.7 kg (105 lb 2.6 oz)       Palliative Assessment/Data:    Flowsheet Rows   Flowsheet Row Most Recent Value  Intake Tab  Referral Department  Hospitalist  Unit at Time of Referral  Oncology Unit  Palliative Care Primary Diagnosis  Cancer  Date Notified  02/06/16  Palliative Care Type  Return patient Palliative Care  Reason for referral  Pain, Non-pain Symptom, Clarify Goals of Care  Date of Admission  02/05/16  Date first seen by Palliative Care  02/07/16  # of days IP prior to Palliative referral  1  Clinical Assessment  Palliative Performance Scale Score  30%  Pain Max last 24 hours  6  Pain Min Last 24 hours  4  Dyspnea Max Last 24 Hours  4  Dyspnea Min Last 24 hours  3  Nausea Max Last 24 Hours  4  Nausea Min Last 24 Hours  3  Anxiety Max Last 24 Hours  5  Anxiety Min Last 24 Hours  4  Psychosocial & Spiritual Assessment  Palliative Care Outcomes  Patient/Family meeting held?  Yes  Who was at the meeting?  patient daughter   Palliative Care follow-up planned  Yes, Home      Patient Active Problem List   Diagnosis Date Noted  . Goals of care, counseling/discussion   . DNR (do not resuscitate)   . Ascites   . Encounter for palliative care   . Abdominal pain 02/06/2016  . UTI (lower urinary tract infection) 02/06/2016  . Absolute anemia 02/06/2016  . Gastric adenocarcinoma ([place]) 02/06/2016  . Cancer  related pain   . 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 Add OxyContin 80 mg PO Q 8 hours scheduled and monitor. Patient with existential suffering, wishes for some degree of life prolongation, states she has several affairs that she needs to get in order.    Discussed with both patient and bedside RN Caren Griffins present at the bedside.    few changes to current bowel regimen, decreased dose of Miralax as patient is now having several good bowel movements  Goals of care discussions: established DNR DNI on 02-08-16. Ongoing discussions with patient and daughter regarding home with hospice on D/C. They do have questions for oncology with regards to immunotherapy.  Palliative care will continue to follow along, address symptoms and help guide appropriate decision-making. Discussed with Dr. Eliseo Squires on 02-10-16   Code Status:    Code Status Orders        Start     Ordered   02/08/16 1058  Do not attempt resuscitation (DNR)  Continuous    Question Answer Comment  In the event of cardiac or respiratory ARREST Do  not call a "code blue"   In the event of cardiac or respiratory ARREST Do not perform Intubation, CPR, defibrillation or ACLS   In the event of cardiac or respiratory ARREST Use medication by any route, position, wound care,  and other measures to relive pain and suffering. May use oxygen, suction and manual treatment of airway obstruction as needed for comfort.      02/08/16 1059    Code Status History    Date Active Date Inactive Code Status Order ID Comments User Context   02/06/2016  6:21 AM 02/08/2016 10:59 AM Full Code [id number]  Rise Patience, MD ED       Prognosis:   weeks to months.   Discharge Planning:  Home with Hospice is highly recommended, patient wishes to discuss further with daughter Selena Hayes and is asking whether or not she would be a candidate for immunotherapy  Care plan was discussed with  Patient, daughter, Dr. Eliseo Squires  Thank you for allowing the Palliative Medicine Team to assist in the care of this patient.   Time In:  11 Time Out: 1135 Total Time 35 Prolonged Time Billed  no       Greater than 50%  of this time was spent counseling and coordinating care related to the above assessment and plan.  Loistine Chance, MD [phone]  Please contact Palliative Medicine Team phone at [phone] for questions and concerns.

## 2016-02-10 NOTE — Progress Notes (Signed)
[facility] PROGRESS NOTE  Selena Hayes [id number] DOB: 1960-07-11 DOA: 02/05/2016 PCP: No PCP Per Patient  Principal Problem:   Abdominal pain Active Problems:   UTI (lower urinary tract infection)   Absolute anemia   Gastric cancer (HCC)    Brief summary  55 y.o.femalewith PMH of metastatic gastric cancer with metastasis to peritoneal, s/p gastrectomy, complicated intraabdominal abscess, who was brought to the ER by patient's daughter from [place] after patient was in the ER at [facility] for abdominal pain. Patient received received 3 cycles chemotherapy. She has been having abdominal pain since the surgery, it has getting worse daily, also developed 25 pounds weight loss in the past few months. -ED: patient is uncomfortable with severe abdominal pains. CT abd showed peritoneal carcinomatosis, ascites   Assessment/Plan:  Intractable abdominal pains in the setting of metastatic cancer, peritoneal carcinomatosis. Patient was on fentanyl patch+prn percocet prior to admission  -she reports better pain control with iv dilaudid PCA but worse this AM - defer to palliative care -s/p paracentesis - 3BMs so far  Metastatic gastric cancer with metastasis to peritoneal, s/p gastrectomy. Prognosis poor, appreciate oncology input. Consulted palliative care  UTI. Cont levofloxacin -no growth on culture but done after abx -d/c since had 3 days of treatment  Hypokalemia/hypomagesium -replace  Constipation -lactulose -had BMs so far  Code Status: full Family Communication: d/w patient, RN and brother Disposition Plan: home with hospice???   Consultants:  Palliative care   oncology  Procedures:  CT abd   Antibiotics:  Levofloxacin 9/7>>>9/10  HPI/Subjective: Had BM only able to take in small amounts of PO food  Objective: Vitals:   02/10/16 0412 02/10/16 0432  BP:  (!) 132/92  Pulse:  (!) 102  Resp: 20 18  Temp:  98.5 F (36.9 C)     Intake/Output Summary (Last 24 hours) at 02/10/16 1024 Last data filed at 02/10/16 0536  Gross per 24 hour  Intake              600 ml  Output              940 ml  Net             -340 ml   Filed Weights   02/05/16 2344 02/06/16 0628  Weight: 41.7 kg (92 lb) 47.7 kg (105 lb 2.6 oz)    Exam:   General:  Alert, no distress   Cardiovascular: s1,s2 rrr  Respiratory: CTA BL   Abdomen: mild distended, mild tender   Musculoskeletal: no leg edema    Data Reviewed: Basic Metabolic Panel:  Recent Labs Lab 02/06/16 0006 02/06/16 0027 02/07/16 0510 02/09/16 0410 02/09/16 0412 02/10/16 0415  NA 133* 132* 138  --  140 136  K 3.5 3.5 3.4*  --  3.1* 4.0  CL 97* 95* 105  --  109 101  CO2 25  --  27  --  25 27  GLUCOSE 105* 101* 118*  --  282* 123*  BUN 12 10 9   --  6 9  CREATININE 0.58 0.50 0.44  --  0.89 0.47  CALCIUM 8.8*  --  8.6*  --  7.6* 8.5*  MG  --   --   --  1.3*  --  1.9   Liver Function Tests:  Recent Labs Lab 02/06/16 0006 02/07/16 0510  AST 15 15  ALT 17 13*  ALKPHOS 75 60  BILITOT 1.0 0.4  PROT 6.6 5.8*  ALBUMIN 2.7* 2.3*  Recent Labs Lab 02/06/16 0006  LIPASE 15   No results for input(s): AMMONIA in the last 168 hours. CBC:  Recent Labs Lab 02/06/16 0006 02/06/16 0027 02/07/16 0510 02/09/16 0412  WBC 9.1  --  5.5 5.8  NEUTROABS 6.7  --   --   --   HGB 7.7* 11.9* 10.0* 10.2*  HCT 23.0* 35.0* 30.1* 30.7*  MCV 89.1  --  89.6 89.8  PLT 416*  --  318 277   Cardiac Enzymes: No results for input(s): CKTOTAL, CKMB, CKMBINDEX, TROPONINI in the last 168 hours. BNP (last 3 results) No results for input(s): BNP in the last 8760 hours.  ProBNP (last 3 results) No results for input(s): PROBNP in the last 8760 hours.  CBG:  Recent Labs Lab 02/08/16 2325 02/09/16 0740 02/09/16 1620 02/09/16 2002 02/10/16 0801  GLUCAP 132* 106* 108* 123* 111*    Recent Results (from the past 240 hour(s))  Blood culture (routine x 2)      Status: None (Preliminary result)   Collection Time: 02/06/16 12:06 AM  Result Value Ref Range Status   Specimen Description BLOOD PORT  Final   Special Requests BOTTLES DRAWN AEROBIC AND ANAEROBIC 5ML EA  Final   Culture   Final    NO GROWTH 3 DAYS Performed at [facility]    Report Status PENDING  Incomplete  Blood culture (routine x 2)     Status: None (Preliminary result)   Collection Time: 02/06/16 12:38 AM  Result Value Ref Range Status   Specimen Description BLOOD BLOOD RIGHT FOREARM  Final   Special Requests BOTTLES DRAWN AEROBIC AND ANAEROBIC 5ML EA  Final   Culture   Final    NO GROWTH 3 DAYS Performed at [facility]    Report Status PENDING  Incomplete  Culture, Urine     Status: None   Collection Time: 02/06/16  2:40 AM  Result Value Ref Range Status   Specimen Description URINE, RANDOM  Final   Special Requests NONE  Final   Culture NO GROWTH Performed at [facility]   Final   Report Status 02/08/2016 FINAL  Final  Culture, body fluid-bottle     Status: None (Preliminary result)   Collection Time: 02/07/16  3:12 PM  Result Value Ref Range Status   Specimen Description PERITONEAL  Final   Special Requests NONE  Final   Culture NO GROWTH 2 DAYS  Final   Report Status PENDING  Incomplete  Gram stain     Status: None   Collection Time: 02/07/16  3:12 PM  Result Value Ref Range Status   Specimen Description PERITONEAL  Final   Special Requests NONE  Final   Gram Stain   Final    WBC PRESENT, PREDOMINANTLY MONONUCLEAR NO ORGANISMS SEEN CYTOSPIN SMEAR    Report Status 02/07/2016 FINAL  Final     Studies: No results found.  Scheduled Meds: . feeding supplement  1 Container Oral TID BM  . HYDROmorphone   Intravenous Q4H  . lactulose  20 g Oral TID  . milk and molasses  1 enema Rectal Once  . nicotine  14 mg Transdermal Daily  . pantoprazole  40 mg Oral BID  . polyethylene glycol  17 g Oral BID  . senna  2 tablet Oral QHS    Continuous Infusions:   Principal Problem:   Abdominal pain Active Problems:   UTI (lower urinary tract infection)   Absolute anemia   Gastric adenocarcinoma ([place])  Cancer related pain   Constipation   Encounter for palliative care   Goals of care, counseling/discussion   DNR (do not resuscitate)   Ascites    Time spent: 25 minutes     [facility] Pager [phone]. If 7PM-7AM, please contact night-coverage at [email or web address], password [facility] 02/10/2016, 10:24 AM  LOS: 4 days

## 2016-02-10 NOTE — Progress Notes (Signed)
PT Cancellation Note  Patient Details Name: Selena Hayes MRN: [id number] DOB: [birth year]   Cancelled Treatment:    Reason Eval/Treat Not Completed: Pain limiting ability to participate, will follow along for PT needs prior to Dc. Currently is up  To BR with assist.    Claretha Cooper 02/10/2016, 9:00 AM Tresa Endo PT [phone]

## 2016-02-11 DIAGNOSIS — R180 Malignant ascites: Secondary | ICD-10-CM

## 2016-02-11 DIAGNOSIS — C162 Malignant neoplasm of body of stomach: Secondary | ICD-10-CM

## 2016-02-11 DIAGNOSIS — E43 Unspecified severe protein-calorie malnutrition: Secondary | ICD-10-CM

## 2016-02-11 LAB — BASIC METABOLIC PANEL
Anion gap: 9 (ref 5–15)
BUN: 10 mg/dL (ref 6–20)
CALCIUM: 8.8 mg/dL — AB (ref 8.9–10.3)
CHLORIDE: 97 mmol/L — AB (ref 101–111)
CO2: 28 mmol/L (ref 22–32)
CREATININE: 0.6 mg/dL (ref 0.44–1.00)
GFR calc Af Amer: >60 mL/min (ref >60)
GFR calc non Af Amer: >60 mL/min (ref >60)
GLUCOSE: 117 mg/dL — AB (ref 65–99)
Potassium: 4.1 mmol/L (ref 3.5–5.1)
Sodium: 134 mmol/L — ABNORMAL LOW (ref 135–145)

## 2016-02-11 LAB — GLUCOSE, CAPILLARY
GLUCOSE-CAPILLARY: 106 mg/dL — AB (ref 65–99)
Glucose-Capillary: 108 mg/dL — ABNORMAL HIGH (ref 65–99)
Glucose-Capillary: 119 mg/dL — ABNORMAL HIGH (ref 65–99)
Glucose-Capillary: 123 mg/dL — ABNORMAL HIGH (ref 65–99)

## 2016-02-11 LAB — CULTURE, BLOOD (ROUTINE X 2)
CULTURE: NO GROWTH
CULTURE: NO GROWTH

## 2016-02-11 LAB — MAGNESIUM: Magnesium: 1.7 mg/dL (ref 1.7–2.4)

## 2016-02-11 MED ORDER — HYDROMORPHONE BOLUS VIA INFUSION
1.0000 mg | INTRAVENOUS | Status: DC | PRN
Start: 2016-02-11 — End: 2016-02-16
  Administered 2016-02-12 – 2016-02-15 (×14): 1 mg via INTRAVENOUS
  Filled 2016-02-11: qty 1

## 2016-02-11 MED ORDER — HYDROMORPHONE 1 MG/ML IV SOLN
INTRAVENOUS | Status: DC
  Administered 2016-02-11: 3.93 mg via INTRAVENOUS
  Administered 2016-02-11: 11.9 mg via INTRAVENOUS
  Administered 2016-02-12 (×3): via INTRAVENOUS
  Administered 2016-02-12: 10.8 mg via INTRAVENOUS
  Administered 2016-02-12: 8.8 mg via INTRAVENOUS
  Administered 2016-02-12: 6.5 mg via INTRAVENOUS
  Administered 2016-02-12: 9.57 mg via INTRAVENOUS
  Administered 2016-02-13: 12.69 mg via INTRAVENOUS
  Administered 2016-02-13: 16.34 mg via INTRAVENOUS
  Administered 2016-02-13: 16.25 mg via INTRAVENOUS
  Filled 2016-02-11 (×4): qty 25

## 2016-02-11 MED ORDER — GI COCKTAIL ~~LOC~~
30.0000 mL | Freq: Once | ORAL | Status: AC
  Administered 2016-02-11: 30 mL via ORAL
  Filled 2016-02-11: qty 30

## 2016-02-11 NOTE — Progress Notes (Signed)
Daily Progress Note   Patient Name: Selena Hayes       Date: 02/11/2016 DOB: July 28, 1960  Age: 55 y.o. MRN#: [id number] Attending Physician: Geradine Girt, DO Primary Care Physician: No PCP Per Patient Admit Date: 02/05/2016  Reason for Consultation/Follow-up: Establishing goals of care  Life limiting illness of metastatic gastric cancer Subjective: Met with patient in conjunction with Dr. Burr Medico this afternoon.  Discussed pathways moving forward including home with hospice.  Patient reports today her goal is to be home, be comfortable, and spend time with her grandchildren.  We discussed options for home pain management including d/c with CADD pump with hospice. See below:  Length of Stay: 5  Current Medications: Scheduled Meds:  . feeding supplement  1 Container Oral TID BM  . HYDROmorphone   Intravenous Q4H  . lactulose  20 g Oral TID  . milk and molasses  1 enema Rectal Once  . nicotine  14 mg Transdermal Daily  . pantoprazole  40 mg Oral BID  . polyethylene glycol  17 g Oral Daily  . senna  2 tablet Oral QHS    Continuous Infusions:    PRN Meds: acetaminophen **OR** acetaminophen, bisacodyl, diphenhydrAMINE **OR** diphenhydrAMINE, HYDROmorphone, iopamidol, milk and molasses, ondansetron (ZOFRAN) IV, ondansetron **OR** [DISCONTINUED] ondansetron (ZOFRAN) IV, promethazine, sodium chloride flush  Physical Exam         Weak frail elderly lady resting in bed S1-S2 Clear breath sounds anteriorly Abdomen firm, less distended-patient went for paracenteses on 9-8  Extremities warm to touch no edema Awake alert oriented  Vital Signs: BP 128/80 (BP Location: Left Arm)   Pulse (!) 103 Comment: informed RN   Temp 98.4 F (36.9 C) (Oral)   Resp 17   Ht _0  (1.575 m)   Wt 47.7 kg  (105 lb 2.6 oz)   SpO2 92%   BMI 19.23 kg/m  SpO2: SpO2: 92 % O2 Device: O2 Device: Nasal Cannula O2 Flow Rate: O2 Flow Rate (L/min): 2 L/min  Intake/output summary:   Intake/Output Summary (Last 24 hours) at 02/11/16 2223 Last data filed at 02/11/16 1900  Gross per 24 hour  Intake              240 ml  Output  0 ml  Net              240 ml   LBM: Last BM Date: 02/10/16 Baseline Weight: Weight: 41.7 kg (92 lb) Most recent weight: Weight: 47.7 kg (105 lb 2.6 oz)       Palliative Assessment/Data:    Flowsheet Rows   Flowsheet Row Most Recent Value  Intake Tab  Referral Department  Hospitalist  Unit at Time of Referral  Oncology Unit  Palliative Care Primary Diagnosis  Cancer  Date Notified  02/06/16  Palliative Care Type  Return patient Palliative Care  Reason for referral  Pain, Non-pain Symptom, Clarify Goals of Care  Date of Admission  02/05/16  Date first seen by Palliative Care  02/07/16  # of days IP prior to Palliative referral  1  Clinical Assessment  Palliative Performance Scale Score  30%  Pain Max last 24 hours  6  Pain Min Last 24 hours  4  Dyspnea Max Last 24 Hours  4  Dyspnea Min Last 24 hours  3  Nausea Max Last 24 Hours  4  Nausea Min Last 24 Hours  3  Anxiety Max Last 24 Hours  5  Anxiety Min Last 24 Hours  4  Psychosocial & Spiritual Assessment  Palliative Care Outcomes  Patient/Family meeting held?  Yes  Who was at the meeting?  patient daughter   Palliative Care follow-up planned  Yes, Home      Patient Active Problem List   Diagnosis Date Noted  . Metastasis from gastric cancer ([place])   . Goals of care, counseling/discussion   . DNR (do not resuscitate)   . Ascites   . Encounter for palliative care   . Abdominal pain 02/06/2016  . UTI (lower urinary tract infection) 02/06/2016  . Absolute anemia 02/06/2016  . Gastric adenocarcinoma ([place]) 02/06/2016  . Cancer related pain   . 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  Plan to restart basal rate on Dilaudid PCA at 39m/hr, continue bolus rate to 0.8 mg per hour PRN Q 15 minutes. Patient with existential suffering, wishes for some degree of life prolongation, states she has several affairs that she needs to get in order but understands poor prognosis.  She reports today she would like to explore option for home with hospice and continuation of PCA for pain management on d/c.  Discussed immunotherapy with Dr. FBurr Medicotoday.  Tests pending, however chance of being responsive to immunotherapy is low (single digit percentage)    Continue current bowel regimen, patient is now having several good bowel movements Goals of care discussions: established DNR DNI on 02-08-16. Ongoing discussions with patient and daughter regarding home with hospice on D/C. Patient reports today that she is agreeable to home with hospice support.  Will ask CM to present options. Palliative care will continue to follow along, address symptoms and help guide appropriate decision-making.   Code Status:    Code Status Orders  Start     Ordered   02/08/16 1058  Do not attempt resuscitation (DNR)  Continuous    Question Answer Comment  In the event of cardiac or respiratory ARREST Do not call a "code blue"   In the event of cardiac or respiratory ARREST Do not perform  Intubation, CPR, defibrillation or ACLS   In the event of cardiac or respiratory ARREST Use medication by any route, position, wound care, and other measures to relive pain and suffering. May use oxygen, suction and manual treatment of airway obstruction as needed for comfort.      02/08/16 1059    Code Status History    Date Active Date Inactive Code Status Order ID Comments User Context   02/06/2016  6:21 AM 02/08/2016 10:59 AM Full Code [id number]  Rise Patience, MD ED       Prognosis:  Likely weeks.  Her prognosis is less than 6 months and she should qualify for home hospice if so desired.   Discharge Planning:  Home with Hospice is highly recommended.  Patient reports being agreeable to this today. Will ask CM to present options.    Care plan was discussed with  Patient, daughter, Dr. Eliseo Squires, Dr. Burr Medico  Thank you for allowing the Palliative Medicine Team to assist in the care of this patient.   Time In:  1500 Time Out: 1545 Total Time 45 Prolonged Time Billed  no       Greater than 50%  of this time was spent counseling and coordinating care related to the above assessment and plan.  Micheline Rough, MD  Please contact Palliative Medicine Team phone at [phone] for questions and concerns.

## 2016-02-11 NOTE — Progress Notes (Signed)
PT Cancellation Note  Patient Details Name: Selena Hayes MRN: [id number] DOB: 05-Sep-1960   Cancelled Treatment:    Reason Eval/Treat Not Completed:Attempted PT eval-pt declined participation on today.    Weston Anna, MPT Pager: [phone]

## 2016-02-11 NOTE — Progress Notes (Signed)
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 Patient received received 3 cycles chemotherapy. She has been having abdominal pain since the surgery, it has getting worse daily, also developed 25 pounds weight loss in the past few months. -ED: patient is uncomfortable with severe abdominal pains. CT abd showed peritoneal carcinomatosis, ascites   Assessment/Plan:  Intractable abdominal pains in the setting of metastatic cancer, peritoneal carcinomatosis. Patient was on fentanyl patch+prn percocet prior to admission  -she reports better pain control with iv dilaudid PCA but worse this AM - defer to palliative care -s/p paracentesis-- appears to be malignant defer to oncology  Metastatic gastric cancer with metastasis to peritoneal, s/p gastrectomy. Prognosis poor, appreciate oncology input. Consulted palliative care  UTI. Cont levofloxacin -no growth on culture but done after abx -d/c since had 3 days of treatment  Hypokalemia/hypomagesium -replace  Constipation -lactulose -had BMs so far  Code Status: full Family Communication: d/w patient, RN Disposition Plan: home with hospice??? Once pain controlled   Consultants:  Palliative care   oncology  Procedures:  CT abd   Antibiotics:  Levofloxacin 9/7>>>9/10  HPI/Subjective: Still in denial about prognosis -oxycontin was too much for patient did not like  Objective: Vitals:   02/11/16 0754 02/11/16 1246  BP:     Pulse:    Resp: 12 11  Temp:      Intake/Output Summary (Last 24 hours) at 02/11/16 1322 Last data filed at 02/11/16 0950  Gross per 24 hour  Intake              240 ml  Output                0 ml  Net              240 ml   Filed Weights   02/05/16 2344 02/06/16 0628  Weight: 41.7 kg (92 lb) 47.7 kg (105 lb 2.6 oz)    Exam:   General:  On PCA- sleeping  Cardiovascular: s1,s2 rrr  Respiratory: CTA BL   Abdomen: mild distended, mild tender   Musculoskeletal: no leg edema    Data Reviewed: Basic Metabolic Panel:  Recent Labs Lab 02/06/16 0006 02/06/16 0027 02/07/16 0510 02/09/16 0410 02/09/16 0412 02/10/16 0415 02/11/16 0600  NA 133* 132* 138  --  140 136 134*  K 3.5 3.5 3.4*  --  3.1* 4.0 4.1  CL 97* 95* 105  --  109 101 97*  CO2 25  --  27  --  25 27 28   GLUCOSE 105* 101* 118*  --  282* 123* 117*  BUN 12 10 9   --  6 9 10   CREATININE 0.58 0.50 0.44  --  0.89 0.47 0.60  CALCIUM 8.8*  --  8.6*  --  7.6* 8.5* 8.8*  MG  --   --   --  1.3*  --  1.9 1.7   Liver Function Tests:  Recent Labs Lab 02/06/16 0006 02/07/16 0510  AST 15 15  ALT 17 13*  ALKPHOS 75 60  BILITOT 1.0 0.4  PROT 6.6 5.8*  ALBUMIN 2.7* 2.3*    Recent Labs Lab 02/06/16 0006  LIPASE 15   No results for input(s): AMMONIA in the last 168 hours. CBC:  Recent Labs Lab 02/06/16 0006 02/06/16 0027 02/07/16 0510 02/09/16 0412  WBC 9.1  --  5.5 5.8  NEUTROABS 6.7  --   --   --   HGB 7.7* 11.9* 10.0* 10.2*  HCT 23.0* 35.0* 30.1* 30.7*  MCV 89.1  --  89.6 89.8  PLT 416*  --  318 277   Cardiac Enzymes: No results for input(s): CKTOTAL, CKMB, CKMBINDEX, TROPONINI in the last 168 hours. BNP (last 3 results) No results for input(s): BNP in the last 8760 hours.  ProBNP (last 3 results) No results for input(s): PROBNP in the last 8760 hours.  CBG:  Recent Labs Lab 02/10/16 0801 02/10/16 1620 02/10/16 2150 02/11/16 0003 02/11/16 0728  GLUCAP 111* 117* 102* 108* 123*     Recent Results (from the past 240 hour(s))  Blood culture (routine x 2)     Status: None (Preliminary result)   Collection Time: 02/06/16 12:06 AM  Result Value Ref Range Status   Specimen Description BLOOD PORT  Final   Special Requests BOTTLES DRAWN AEROBIC AND ANAEROBIC 5ML EA  Final   Culture   Final    NO GROWTH 4 DAYS Performed at [facility]    Report Status PENDING  Incomplete  Blood culture (routine x 2)     Status: None (Preliminary result)   Collection Time: 02/06/16 12:38 AM  Result Value Ref Range Status   Specimen Description BLOOD BLOOD RIGHT FOREARM  Final   Special Requests BOTTLES DRAWN AEROBIC AND ANAEROBIC 5ML EA  Final   Culture   Final    NO GROWTH 4 DAYS Performed at [facility]    Report Status PENDING  Incomplete  Culture, Urine     Status: None   Collection Time: 02/06/16  2:40 AM  Result Value Ref Range Status   Specimen Description URINE, RANDOM  Final   Special Requests NONE  Final   Culture NO GROWTH Performed at [facility]   Final   Report Status 02/08/2016 FINAL  Final  Culture, body fluid-bottle     Status: None (Preliminary result)   Collection Time: 02/07/16  3:12 PM  Result Value Ref Range Status   Specimen Description PERITONEAL  Final   Special Requests NONE  Final   Culture NO GROWTH 3 DAYS  Final   Report Status PENDING  Incomplete  Gram stain     Status: None   Collection Time: 02/07/16  3:12 PM  Result Value Ref Range Status   Specimen Description PERITONEAL  Final   Special Requests NONE  Final   Gram Stain   Final    WBC PRESENT, PREDOMINANTLY MONONUCLEAR NO ORGANISMS SEEN CYTOSPIN SMEAR    Report Status 02/07/2016 FINAL  Final     Studies: No results found.  Scheduled Meds: . feeding supplement  1 Container Oral TID BM  . HYDROmorphone   Intravenous Q4H  . lactulose  20 g Oral TID  . milk and molasses  1 enema Rectal Once  . nicotine  14 mg Transdermal Daily  . oxyCODONE  80 mg Oral  Q8H  . pantoprazole  40 mg Oral BID  . polyethylene glycol  17 g Oral Daily  . senna  2 tablet Oral QHS   Continuous Infusions:   Principal Problem:  Abdominal pain Active Problems:   UTI (lower urinary tract infection)   Absolute anemia   Gastric adenocarcinoma ([place])   Cancer related pain   Constipation   Encounter for palliative care   Goals of care, counseling/discussion   DNR (do not resuscitate)   Ascites   Metastasis from gastric cancer ([place])    Time spent: 25 minutes     [place] Pager [phone]. If 7PM-7AM, please contact night-coverage at [email or web address], password [facility] 02/11/2016, 1:22 PM  LOS: 5 days

## 2016-02-11 NOTE — Progress Notes (Signed)
Patient experiencing episodes of nausea and vomiting in small amounts clear secretion.  See MAR. Patient is unable to keep anything in her stomach. Patient became diaphoretic and c/o blurred vision. CBG 119. See vital signs.  Nicotine patch removed.  Please evaluate.

## 2016-02-11 NOTE — Progress Notes (Signed)
Selena Hayes   DOB:10/20/1960   XB#:[id number]   NUU#:[id number]  Subjective: Pt is on dilaudid PCA, has been requiring high does, did not like high dose oxycontin yesterday, still quite weak. I met pt, her daughter [name] and her daughter-in-law Jillyn Ledger, along with palliative medicine doctor freeman around 5:00pm today.   Objective:  Vitals:   02/11/16 2056 02/11/16 2130  BP:  (!) 147/100  Pulse:  (!) 108  Resp: 17 18  Temp:  98.6 F (37 C)    Body mass index is 19.23 kg/m.  Intake/Output Summary (Last 24 hours) at 02/11/16 2253 Last data filed at 02/11/16 1900  Gross per 24 hour  Intake              240 ml  Output                0 ml  Net              240 ml    The patient is on nasal cannula oxygen, cachectic  Sclerae unicteric  Oropharynx clear  No peripheral adenopathy  Lungs clear -- no rales or rhonchi  Heart regular rate and rhythm  Abdomen diffuse mild tenderness, less ascites   MSK no focal spinal tenderness, no peripheral edema  Neuro nonfocal    CBG (last 3)   Recent Labs  02/11/16 0003 02/11/16 0728 02/11/16 1602  GLUCAP 108* 123* 106*     Labs:  Lab Results  Component Value Date   WBC 5.8 02/09/2016   HGB 10.2 (L) 02/09/2016   HCT 30.7 (L) 02/09/2016   MCV 89.8 02/09/2016   PLT 277 02/09/2016   NEUTROABS 6.7 02/06/2016    Urine Studies No results for input(s): UHGB, CRYS in the last 72 hours.  Invalid input(s): UACOL, UAPR, USPG, UPH, UTP, UGL, [place], UBIL, UNIT, UROB, [place], UEPI, UWBC, Junie Panning [place], [place], [place]  Basic Metabolic Panel:  Recent Labs Lab 02/06/16 0006 02/06/16 0027 02/07/16 0510 02/09/16 0410 02/09/16 0412 02/10/16 0415 02/11/16 0600  NA 133* 132* 138  --  140 136 134*  K 3.5 3.5 3.4*  --  3.1* 4.0 4.1  CL 97* 95* 105  --  109 101 97*  CO2 25  --  27  --  25 27 28   GLUCOSE 105* 101* 118*  --  282* 123* 117*  BUN 12 10 9   --  6 9 10   CREATININE 0.58 0.50 0.44  --  0.89 0.47 0.60  CALCIUM 8.8*  --  8.6*  --   7.6* 8.5* 8.8*  MG  --   --   --  1.3*  --  1.9 1.7   GFR Estimated Creatinine Clearance: 59.1 mL/min (by C-G formula based on SCr of 0.8 mg/dL). Liver Function Tests:  Recent Labs Lab 02/06/16 0006 02/07/16 0510  AST 15 15  ALT 17 13*  ALKPHOS 75 60  BILITOT 1.0 0.4  PROT 6.6 5.8*  ALBUMIN 2.7* 2.3*    Recent Labs Lab 02/06/16 0006  LIPASE 15   No results for input(s): AMMONIA in the last 168 hours. Coagulation profile No results for input(s): INR, PROTIME in the last 168 hours.  CBC:  Recent Labs Lab 02/06/16 0006 02/06/16 0027 02/07/16 0510 02/09/16 0412  WBC 9.1  --  5.5 5.8  NEUTROABS 6.7  --   --   --   HGB 7.7* 11.9* 10.0* 10.2*  HCT 23.0* 35.0* 30.1* 30.7*  MCV 89.1  --  89.6 89.8  PLT  416*  --  318 277   Cardiac Enzymes: No results for input(s): CKTOTAL, CKMB, CKMBINDEX, TROPONINI in the last 168 hours. BNP: Invalid input(s): POCBNP CBG:  Recent Labs Lab 02/10/16 1620 02/10/16 2150 02/11/16 0003 02/11/16 0728 02/11/16 1602  GLUCAP 117* 102* 108* 123* 106*   D-Dimer No results for input(s): DDIMER in the last 72 hours. Hgb A1c No results for input(s): HGBA1C in the last 72 hours. Lipid Profile No results for input(s): CHOL, HDL, LDLCALC, TRIG, CHOLHDL, LDLDIRECT in the last 72 hours. Thyroid function studies No results for input(s): TSH, T4TOTAL, T3FREE, THYROIDAB in the last 72 hours.  Invalid input(s): FREET3 Anemia work up No results for input(s): VITAMINB12, FOLATE, FERRITIN, TIBC, IRON, RETICCTPCT in the last 72 hours. Microbiology Recent Results (from the past 240 hour(s))  Blood culture (routine x 2)     Status: None   Collection Time: 02/06/16 12:06 AM  Result Value Ref Range Status   Specimen Description BLOOD PORT  Final   Special Requests BOTTLES DRAWN AEROBIC AND ANAEROBIC 5ML EA  Final   Culture   Final    NO GROWTH 5 DAYS Performed at [facility]    Report Status 02/11/2016 FINAL  Final  Blood culture  (routine x 2)     Status: None   Collection Time: 02/06/16 12:38 AM  Result Value Ref Range Status   Specimen Description BLOOD BLOOD RIGHT FOREARM  Final   Special Requests BOTTLES DRAWN AEROBIC AND ANAEROBIC 5ML EA  Final   Culture   Final    NO GROWTH 5 DAYS Performed at [facility]    Report Status 02/11/2016 FINAL  Final  Culture, Urine     Status: None   Collection Time: 02/06/16  2:40 AM  Result Value Ref Range Status   Specimen Description URINE, RANDOM  Final   Special Requests NONE  Final   Culture NO GROWTH Performed at [facility]   Final   Report Status 02/08/2016 FINAL  Final  Culture, body fluid-bottle     Status: None (Preliminary result)   Collection Time: 02/07/16  3:12 PM  Result Value Ref Range Status   Specimen Description PERITONEAL  Final   Special Requests NONE  Final   Culture NO GROWTH 4 DAYS  Final   Report Status PENDING  Incomplete  Gram stain     Status: None   Collection Time: 02/07/16  3:12 PM  Result Value Ref Range Status   Specimen Description PERITONEAL  Final   Special Requests NONE  Final   Gram Stain   Final    WBC PRESENT, PREDOMINANTLY MONONUCLEAR NO ORGANISMS SEEN CYTOSPIN SMEAR    Report Status 02/07/2016 FINAL  Final      Studies:  No results found.  Assessment:  55 year old Caucasian female, who was diagnosed with T3N0M0 stage IIIA gastric body adenocarcinoma, status post neoadjuvant chemotherapy, followed by partial gastrectomy. Unfortunately she was found to have peritoneal metastasis during the surgery. She now presents worsening abdominal pain, nausea, anorexia, weight loss and constipation.  1. Intractable abdominal pain, secondary to the peritoneal carcinomatosis, ascites culture (-)  2. Moderate ascites, cytology (+) malignant cells  3. Metastatic gastric adenocarcinoma with metastasis to peritoneum 4. UTI 5. Anemia in neoplastic disease 6. Weight loss and severe malnutrition 7. Anorexia,  constipation, and nausea, improving   Recommendations: -Dr. Domingo Cocking and I had conversation with pt and her family members about her goal of care, and we recommend hospice. Pt maybe qualified  for inpt hospice due to her difficult pain control. The patient and her family members are more receptive to palliative care now  -we also discussed management of her malignant ascites. She will likely need repeat paracentesis for symptom relief. If she requires frequent paracentesis, I'll recommend Pleurx placement -I will call her when I have her tumor MSI test result back. I requested her tumor tissue from [facility] today, it may take 2-3 weeks to have the result back.  -If she will be discharged home with PCA, I prefer hospice MD to manage her pain.   Will follow up.    Truitt Merle, MD 02/11/2016  10:53 PM

## 2016-02-12 ENCOUNTER — Inpatient Hospital Stay (HOSPITAL_COMMUNITY): Payer: Medicaid - Out of State

## 2016-02-12 DIAGNOSIS — C799 Secondary malignant neoplasm of unspecified site: Secondary | ICD-10-CM

## 2016-02-12 DIAGNOSIS — C801 Malignant (primary) neoplasm, unspecified: Secondary | ICD-10-CM

## 2016-02-12 DIAGNOSIS — R1114 Bilious vomiting: Secondary | ICD-10-CM

## 2016-02-12 LAB — CULTURE, BODY FLUID-BOTTLE: CULTURE: NO GROWTH

## 2016-02-12 LAB — GLUCOSE, CAPILLARY
Glucose-Capillary: 111 mg/dL — ABNORMAL HIGH (ref 65–99)
Glucose-Capillary: 89 mg/dL (ref 65–99)

## 2016-02-12 LAB — CULTURE, BODY FLUID W GRAM STAIN -BOTTLE

## 2016-02-12 MED ORDER — METOCLOPRAMIDE HCL 5 MG/ML IJ SOLN
5.0000 mg | Freq: Four times a day (QID) | INTRAMUSCULAR | Status: DC
  Administered 2016-02-12 – 2016-02-16 (×16): 5 mg via INTRAVENOUS
  Filled 2016-02-12 (×16): qty 2

## 2016-02-12 MED ORDER — OLANZAPINE 5 MG PO TABS
5.0000 mg | ORAL_TABLET | Freq: Every day | ORAL | Status: DC
  Administered 2016-02-12 – 2016-02-15 (×3): 5 mg via ORAL
  Filled 2016-02-12 (×3): qty 1

## 2016-02-12 MED ORDER — LORAZEPAM 2 MG/ML IJ SOLN
0.5000 mg | INTRAMUSCULAR | Status: DC | PRN
Start: 2016-02-12 — End: 2016-02-16
  Administered 2016-02-12 – 2016-02-13 (×3): 0.5 mg via INTRAVENOUS
  Filled 2016-02-12 (×3): qty 1

## 2016-02-12 MED ORDER — HYDROMORPHONE BOLUS VIA INFUSION
2.0000 mg | INTRAVENOUS | Status: AC
  Administered 2016-02-12: 2 mg via INTRAVENOUS
  Filled 2016-02-12: qty 2

## 2016-02-12 NOTE — Progress Notes (Signed)
Daily Progress Note   Patient Name: Selena Hayes       Date: 02/12/2016 DOB: [birth year]  Age: 55 y.o. MRN#: [id number] Attending Physician: Charlynne Cousins, MD Primary Care Physician: No PCP Per Patient Admit Date: 02/05/2016  Reason for Consultation/Follow-up: Establishing goals of care  Life limiting illness of metastatic gastric cancer Subjective: Met with patient in conjunction with son, Selena Hayes, and daughter in Sports coach, Selena Hayes.  Patient reports "terrible" night with pain not well controlled.  It appears basal rate was not started last evening.  She has only gotten 32m of dilaudid over last 24 hours as compared to 687mthe 24 hours prior to that. See below:  Length of Stay: 6  Current Medications: Scheduled Meds:  . feeding supplement  1 Container Oral TID BM  . HYDROmorphone   Intravenous Q4H  . lactulose  20 g Oral TID  . metoCLOPramide (REGLAN) injection  5 mg Intravenous Q6H  . milk and molasses  1 enema Rectal Once  . nicotine  14 mg Transdermal Daily  . OLANZapine  5 mg Oral QHS  . pantoprazole  40 mg Oral BID  . polyethylene glycol  17 g Oral Daily  . senna  2 tablet Oral QHS    Continuous Infusions:    PRN Meds: acetaminophen **OR** acetaminophen, bisacodyl, diphenhydrAMINE **OR** diphenhydrAMINE, HYDROmorphone, iopamidol, LORazepam, milk and molasses, ondansetron (ZOFRAN) IV, ondansetron **OR** [DISCONTINUED] ondansetron (ZOFRAN) IV, sodium chloride flush  Physical Exam         Weak frail elderly lady resting in bed, moderate distress S1-S2 Clear breath sounds anteriorly Abdomen firm, mildly distended-patient went for paracenteses on 9-8, globally tender to palpation  Extremities warm to touch no edema Awake alert oriented  Vital Signs: BP (!) 145/102 (BP  Location: Right Arm)   Pulse (!) 104   Temp 98.2 F (36.8 C) (Oral)   Resp 13   Ht _0  (1.575 m)   Wt 47.7 kg (105 lb 2.6 oz)   SpO2 97%   BMI 19.23 kg/m  SpO2: SpO2: 97 % O2 Device: O2 Device: Nasal Cannula O2 Flow Rate: O2 Flow Rate (L/min): 1.5 L/min  Intake/output summary:   Intake/Output Summary (Last 24 hours) at 02/12/16 1112 Last data filed at 02/12/16 1028  Gross per 24 hour  Intake  240 ml  Output              150 ml  Net               90 ml   LBM: Last BM Date: 02/10/16 Baseline Weight: Weight: 41.7 kg (92 lb) Most recent weight: Weight: 47.7 kg (105 lb 2.6 oz)       Palliative Assessment/Data:    Flowsheet Rows   Flowsheet Row Most Recent Value  Intake Tab  Referral Department  Hospitalist  Unit at Time of Referral  Oncology Unit  Palliative Care Primary Diagnosis  Cancer  Date Notified  02/06/16  Palliative Care Type  Return patient Palliative Care  Reason for referral  Pain, Non-pain Symptom, Clarify Goals of Care  Date of Admission  02/05/16  Date first seen by Palliative Care  02/07/16  # of days IP prior to Palliative referral  1  Clinical Assessment  Palliative Performance Scale Score  30%  Pain Max last 24 hours  6  Pain Min Last 24 hours  4  Dyspnea Max Last 24 Hours  4  Dyspnea Min Last 24 hours  3  Nausea Max Last 24 Hours  4  Nausea Min Last 24 Hours  3  Anxiety Max Last 24 Hours  5  Anxiety Min Last 24 Hours  4  Psychosocial & Spiritual Assessment  Palliative Care Outcomes  Patient/Family meeting held?  Yes  Who was at the meeting?  patient daughter   Palliative Care follow-up planned  Yes, Home      Patient Active Problem List   Diagnosis Date Noted  . Metastasis from gastric cancer ([place])   . Goals of care, counseling/discussion   . DNR (do not resuscitate)   . Ascites   . Encounter for palliative care   . Abdominal pain 02/06/2016  . UTI (lower urinary tract infection) 02/06/2016  . Absolute anemia  02/06/2016  . Gastric adenocarcinoma ([place]) 02/06/2016  . Cancer related pain   . 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  Patient did not tolerate oxycodone.  Plan was to restart basal rate on Dilaudid PCA at 63m/hr, continue bolus rate to 0.8 mg per hour PRN Q 15 minutes.  Unfortunately, basal rate was not started last evening and patient in acute pain this AM.  Given 254mbolus and basal rate implemented.  She was much more comfortable on recheck 30 minutes later.  Patient with existential suffering but understands poor prognosis.  She has been anxious and this has been complicating her pain.  Plan for addition of ativan as needed as well as zyprexa for anxiety as well as nausea.  Nausea worsened overnight.  Change phenergan to reglan and schedule every 6 hours.  Addition of zyprexa.  Discussed with Dr. FeVenetia Constableegarding eval for another drainage.  If possible, I would recommend placement of pleurex prior to d/c for management of recurrent ascites with home hospice.  Discussed immunotherapy with Dr. FeBurr [employer] 9/14.  Tests pending, however chance of being responsive to immunotherapy is low (single digit percentage)   Continue current bowel regimen, patient is now having good bowel movements  Goals of care discussions: established DNR DNI on 02-08-16. Ongoing discussions with  patient and family regarding home with hospice on D/C. Patient reports she is agreeable to home with hospice support, but some concern by daughter in law about exactly where this will occur.  Options seem to include renting apartment vs living with former daughter in law vs residential hospice.  Will ask CM to present options once family knows address of where she desires to go on discharge.  Palliative care will continue to follow along, address symptoms and help guide appropriate decision-making.   Code Status:    Code Status Orders        Start     Ordered   02/08/16 1058  Do not attempt resuscitation (DNR)  Continuous    Question Answer Comment  In the event of cardiac or respiratory ARREST Do not call a "code blue"   In the event of cardiac or respiratory ARREST Do not perform Intubation, CPR, defibrillation or ACLS   In the event of cardiac or respiratory ARREST Use medication by any route, position, wound care, and other measures to relive pain and suffering. May use oxygen, suction and manual treatment of airway obstruction as needed for comfort.      02/08/16 1059    Code Status History    Date Active Date Inactive Code Status Order ID Comments User Context   02/06/2016  6:21 AM 02/08/2016 10:59 AM Full Code [id number]  Rise Patience, MD ED       Prognosis: - Her prognosis is certainly less than 6 months and she should qualify for home hospice.  Based on encounter this AM, I am concerned that she may be progressing quickly and her time could be as short as 2 weeks.    Discharge Planning: Home with Hospice.  Her goal is still to transition out of the hospital to someone's home until she is closer to death and eventually transition to residential hospice. Patient has no home locally.  Her daughter in law, Selena Hayes, is present this AM and expressed that she feels she would be best served in residential hospice facility or renting an apartment and hiring nursing support.  Family to  meet to discuss where goal is going to be for discharge and will let case mgt know address so she can present options for home hospice services.  Care plan was discussed with  Patient, son, daughter in law, Dr Venetia Constable  Thank you for allowing the Palliative Medicine Team to assist in the care of this patient.   Time In:  0920 Time Out: 1005 Total Time 45 Prolonged Time Billed  no       Greater than 50%  of this time was spent counseling and coordinating care related to the above assessment and plan.  Micheline Rough, MD  Please contact Palliative Medicine Team phone at [phone] for questions and concerns.

## 2016-02-12 NOTE — Progress Notes (Signed)
[facility] PROGRESS NOTE    Progress Note  Selena Hayes  [id number] DOB: 1960-08-10 DOA: 02/05/2016 PCP: No PCP Per Patient     Brief Narrative:   Selena Hayes is an 55 y.o. 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 Patient received received 3 cycles chemotherapy. She has been having abdominal pain since the surgery, it has getting worse daily, also developed 25 pounds weight loss in the past few months. CT abd showed peritoneal carcinomatosis, ascites   Assessment/Plan:   Intractable Abdominal pain in the setting of peritoneal carcinomatosis: Reported good control from baseline IV Dilaudid plus PCA pump. Somehow her pain is out of control, I appreciate palliative Care assistance with pain control. Status post paracentesis on 02/07/2016 yielding 2.8 L, cytology showed malignant adenocarcinoma. Check an abdominal ultrasound as she relates her abdomen is slightly distended.  Metastatic gastric cancer with peritoneal carcinomatosis: Status post gastrectomy poor prognosis and care on board.  UTI (lower urinary tract infection) No growth on urine culture completed 3 day course.  Hypokalemia/hypomagnesemia: Repeated monitor.  Constipation: Continue lactulose.  Normocytic anemia:  Gastric adenocarcinoma [facility]) Care was consulted and she will go home with hospice care.   Encounter for palliative care   Goals of care, counseling/discussion   DNR (do not resuscitate)  DVT prophylaxis: lovenox Family Communication:none Disposition Plan/Barrier to D/C: once pain control Code Status:     Code Status Orders        Start     Ordered   02/08/16 1058  Do not attempt resuscitation (DNR)  Continuous    Question Answer Comment  In the event  of cardiac or respiratory ARREST Do not call a "code blue"   In the event of cardiac or respiratory ARREST Do not perform Intubation, CPR, defibrillation or ACLS   In the event of cardiac or respiratory ARREST Use medication by any route, position, wound care, and other measures to relive pain and suffering. May use oxygen, suction and manual treatment of airway obstruction as needed for comfort.      02/08/16 1059    Code Status History    Date Active Date Inactive Code Status Order ID Comments User Context   02/06/2016  6:21 AM 02/08/2016 10:59 AM Full Code [id number]  Rise Patience, MD ED        IV Access:    Peripheral IV   Procedures and diagnostic studies:   No results found.   Medical Consultants:    None.  Anti-Infectives:   none  Subjective:    [name] she relates her pain was not well controlled overnight with these changes make this morning it is improved.  Objective:    Vitals:   02/12/16 0013 02/12/16 0334 02/12/16 0600 02/12/16 0846  BP:   (!) 145/102   Pulse:   (!) 104   Resp: 20 12 18 13   Temp:   98.2 F (36.8 C)   TempSrc:   Oral   SpO2: 94% 96% 95% 97%  Weight:      Height:        Intake/Output Summary (Last 24 hours) at 02/12/16 1019 Last data filed at 02/11/16 1900  Gross per 24 hour  Intake              240 ml  Output  0 ml  Net              240 ml   Filed Weights   02/05/16 2344 02/06/16 0628  Weight: 41.7 kg (92 lb) 47.7 kg (105 lb 2.6 oz)    Exam: General exam: In no acute distress.Cachectic appearing Respiratory system: Good air movement and clear to auscultation. Cardiovascular system: S1 & S2 heard, RRR. Gastrointestinal system: Abdomen is nondistended, soft and nontender.  Central nervous system: Alert and oriented. No focal neurological deficits. Extremities: No pedal edema. Skin: No rashes, lesions or ulcers Psychiatry: Judgement and insight appear normal. Mood & affect appropriate.     Data Reviewed:    Labs: Basic Metabolic Panel:  Recent Labs Lab 02/06/16 0006 02/06/16 0027 02/07/16 0510 02/09/16 0410 02/09/16 0412 02/10/16 0415 02/11/16 0600  NA 133* 132* 138  --  140 136 134*  K 3.5 3.5 3.4*  --  3.1* 4.0 4.1  CL 97* 95* 105  --  109 101 97*  CO2 25  --  27  --  25 27 28   GLUCOSE 105* 101* 118*  --  282* 123* 117*  BUN 12 10 9   --  6 9 10   CREATININE 0.58 0.50 0.44  --  0.89 0.47 0.60  CALCIUM 8.8*  --  8.6*  --  7.6* 8.5* 8.8*  MG  --   --   --  1.3*  --  1.9 1.7   GFR Estimated Creatinine Clearance: 59.1 mL/min (by C-G formula based on SCr of 0.6 mg/dL). Liver Function Tests:  Recent Labs Lab 02/06/16 0006 02/07/16 0510  AST 15 15  ALT 17 13*  ALKPHOS 75 60  BILITOT 1.0 0.4  PROT 6.6 5.8*  ALBUMIN 2.7* 2.3*    Recent Labs Lab 02/06/16 0006  LIPASE 15   No results for input(s): AMMONIA in the last 168 hours. Coagulation profile No results for input(s): INR, PROTIME in the last 168 hours.  CBC:  Recent Labs Lab 02/06/16 0006 02/06/16 0027 02/07/16 0510 02/09/16 0412  WBC 9.1  --  5.5 5.8  NEUTROABS 6.7  --   --   --   HGB 7.7* 11.9* 10.0* 10.2*  HCT 23.0* 35.0* 30.1* 30.7*  MCV 89.1  --  89.6 89.8  PLT 416*  --  318 277   Cardiac Enzymes: No results for input(s): CKTOTAL, CKMB, CKMBINDEX, TROPONINI in the last 168 hours. BNP (last 3 results) No results for input(s): PROBNP in the last 8760 hours. CBG:  Recent Labs Lab 02/11/16 0003 02/11/16 0728 02/11/16 1602 02/11/16 2349 02/12/16 0738  GLUCAP 108* 123* 106* 119* 111*   D-Dimer: No results for input(s): DDIMER in the last 72 hours. Hgb A1c: No results for input(s): HGBA1C in the last 72 hours. Lipid Profile: No results for input(s): CHOL, HDL, LDLCALC, TRIG, CHOLHDL, LDLDIRECT in the last 72 hours. Thyroid function studies: No results for input(s): TSH, T4TOTAL, T3FREE, THYROIDAB in the last 72 hours.  Invalid input(s): FREET3 Anemia work up: No  results for input(s): VITAMINB12, FOLATE, FERRITIN, TIBC, IRON, RETICCTPCT in the last 72 hours. Sepsis Labs:  Recent Labs Lab 02/06/16 0006 02/07/16 0510 02/09/16 0412  WBC 9.1 5.5 5.8   Microbiology Recent Results (from the past 240 hour(s))  Blood culture (routine x 2)     Status: None   Collection Time: 02/06/16 12:06 AM  Result Value Ref Range Status   Specimen Description BLOOD PORT  Final   Special Requests BOTTLES DRAWN AEROBIC AND ANAEROBIC  5ML EA  Final   Culture   Final    NO GROWTH 5 DAYS Performed at [facility]    Report Status 02/11/2016 FINAL  Final  Blood culture (routine x 2)     Status: None   Collection Time: 02/06/16 12:38 AM  Result Value Ref Range Status   Specimen Description BLOOD BLOOD RIGHT FOREARM  Final   Special Requests BOTTLES DRAWN AEROBIC AND ANAEROBIC 5ML EA  Final   Culture   Final    NO GROWTH 5 DAYS [facility]    Report Status 02/11/2016 FINAL  Final  Culture, Urine     Status: None   Collection Time: 02/06/16  2:40 AM  Result Value Ref Range Status   Specimen Description URINE, RANDOM  Final   Special Requests NONE  Final   Culture NO GROWTH Performed at [facility]   Final   Report Status 02/08/2016 FINAL  Final  Culture, body fluid-bottle     Status: None (Preliminary result)   Collection Time: 02/07/16  3:12 PM  Result Value Ref Range Status   Specimen Description PERITONEAL  Final   Special Requests NONE  Final   Culture NO GROWTH 4 DAYS  Final   Report Status PENDING  Incomplete  Gram stain     Status: None   Collection Time: 02/07/16  3:12 PM  Result Value Ref Range Status   Specimen Description PERITONEAL  Final   Special Requests NONE  Final   Gram Stain   Final    WBC PRESENT, PREDOMINANTLY MONONUCLEAR NO ORGANISMS SEEN CYTOSPIN SMEAR    Report Status 02/07/2016 FINAL  Final     Medications:   . feeding supplement  1 Container Oral TID BM  . HYDROmorphone    Intravenous Q4H  . HYDROmorphone  2 mg Intravenous NOW  . lactulose  20 g Oral TID  . metoCLOPramide (REGLAN) injection  5 mg Intravenous Q6H  . milk and molasses  1 enema Rectal Once  . nicotine  14 mg Transdermal Daily  . OLANZapine  5 mg Oral QHS  . pantoprazole  40 mg Oral BID  . polyethylene glycol  17 g Oral Daily  . senna  2 tablet Oral QHS   Continuous Infusions:   Time spent: 15 min   LOS: 6 days   Charlynne Cousins  [facility] Pager [phone]  *Please refer to [email or web address], password [email or web address] to get updated schedule on who will round on this patient, as hospitalists switch teams weekly. If 7PM-7AM, please contact night-coverage at [email or web address], password [email or web address] for any overnight needs.  02/12/2016, 10:19 AM

## 2016-02-12 NOTE — Progress Notes (Signed)
Stopped by to check on pain control.  Pt reports much better on current regimen.  Continue same.  Micheline Rough, MD [facility] Team [phone]

## 2016-02-12 NOTE — Progress Notes (Signed)
PT Cancellation Note  Patient Details Name: Selena Hayes MRN: [id number] DOB: 1960-07-24   Cancelled Treatment:    Reason Eval/Treat Not Completed: Other (comment) Pt has been cancelled by PT each day since 9/10 due to declining participation and/or pain issues.  Pt again with pain this morning (please refer to Dr. Kirstie Mirza note).  Plan is for home hospice vs residential hospice.  PT to sign off at this time.  Please re-order if pt requires PT and pain better controlled (pt able to participate).   Suraya Vidrine,KATHrine E 02/12/2016, 11:43 AM Carmelia Bake, PT, DPT 02/12/2016 Pager: [phone]

## 2016-02-12 NOTE — Care Management Note (Signed)
Case Management Note  Patient Details  Name: Selena Hayes MRN: [id number] Date of Birth: 10/14/1960  Subjective/Objective:    55 yo admitted with Abdominal pain. Hx of gastric cancer with metastasis            Action/Plan: From home. CM consult for home with hospice. This CM met with pt, daughter Tiffany and oldest son at bedside. Choice was offered for home hospice and [facility] was chosen. Tiffany states she will be caring for the pt 24/7. There is some question as to which location pt will reside and this CM checked with [facility] liaison to ensure that [facility] can provide services to pt if she resides in [place],   or [place] counties. Pt will also need CADD pump and hospice MD to be pt attending. This info given to [facility] liaison and it was confirmed that they would be able to provide. Family requesting hospital bed, bsc, overbed table and home 02. [facility] liaison informed. All information faxed to [facility]. CM will continue to follow.  Expected Discharge Date:                  Expected Discharge Plan:  Home w Hospice Care  In-House Referral:     Discharge planning Services  CM Consult  Post Acute Care Choice:  Hospice Choice offered to:  Adult Children  DME Arranged:    DME Agency:     HH Arranged:  Disease Management HH Agency:   ([facility])  Status of Service:  In process, will continue to follow  If discussed at Long Length of Stay Meetings, dates discussed:    Additional Comments:  ,  H, RN 02/12/2016, 2:06 PM  [phone]  

## 2016-02-13 ENCOUNTER — Encounter (HOSPITAL_COMMUNITY): Payer: Self-pay | Admitting: General Surgery

## 2016-02-13 LAB — GLUCOSE, CAPILLARY
GLUCOSE-CAPILLARY: 106 mg/dL — AB (ref 65–99)
Glucose-Capillary: 134 mg/dL — ABNORMAL HIGH (ref 65–99)
Glucose-Capillary: 99 mg/dL (ref 65–99)

## 2016-02-13 MED ORDER — PROMETHAZINE HCL 25 MG/ML IJ SOLN
12.5000 mg | Freq: Four times a day (QID) | INTRAMUSCULAR | Status: DC | PRN
  Administered 2016-02-13 – 2016-02-16 (×6): 12.5 mg via INTRAVENOUS
  Filled 2016-02-13 (×6): qty 1

## 2016-02-13 MED ORDER — HYDROMORPHONE 1 MG/ML IV SOLN: INTRAVENOUS | Status: DC

## 2016-02-13 MED ORDER — HYDROMORPHONE 1 MG/ML IV SOLN
INTRAVENOUS | Status: DC
  Administered 2016-02-13: 15.36 mg via INTRAVENOUS
  Administered 2016-02-13: 8.7 mg via INTRAVENOUS
  Administered 2016-02-13: 15.64 mg via INTRAVENOUS
  Administered 2016-02-13 (×2): via INTRAVENOUS
  Administered 2016-02-13: 7.55 mg via INTRAVENOUS
  Administered 2016-02-14: 10.34 mg via INTRAVENOUS
  Administered 2016-02-14: 7.56 mg via INTRAVENOUS
  Administered 2016-02-14: 03:00:00 via INTRAVENOUS
  Administered 2016-02-14: 12.54 mg via INTRAVENOUS
  Administered 2016-02-14: 13:00:00 via INTRAVENOUS
  Filled 2016-02-13 (×4): qty 25

## 2016-02-13 MED ORDER — VANCOMYCIN HCL IN DEXTROSE 1-5 GM/200ML-% IV SOLN
1000.0000 mg | INTRAVENOUS | Status: AC
Start: 2016-02-14 — End: 2016-02-15

## 2016-02-13 NOTE — Progress Notes (Signed)
Daily Progress Note   Patient Name: Selena Hayes       Date: 02/13/2016 DOB: August 05, 1960  Age: 55 y.o. MRN#: [id number] Attending Physician: Charlynne Cousins, MD Primary Care Physician: No PCP Per Patient Admit Date: 02/05/2016  Reason for Consultation/Follow-up: Establishing goals of care  Life limiting illness of metastatic gastric cancer Subjective: Met with patient in conjunction with brother.  Patient sleeping but arouses easily.  Reports pain fairly well controlled at this time.  Plan for paracentesis/drain placement and hopeful that this will relieve some pressure and allow her to use less pain medication.  She reports appetite may be a little better.  Ate eggs. Has been drinking water as juice burned her throat.  Regular BMs. See below:  Length of Stay: 7  Current Medications: Scheduled Meds:  . feeding supplement  1 Container Oral TID BM  . HYDROmorphone   Intravenous Q4H  . lactulose  20 g Oral TID  . metoCLOPramide (REGLAN) injection  5 mg Intravenous Q6H  . milk and molasses  1 enema Rectal Once  . nicotine  14 mg Transdermal Daily  . OLANZapine  5 mg Oral QHS  . pantoprazole  40 mg Oral BID  . polyethylene glycol  17 g Oral Daily  . senna  2 tablet Oral QHS    Continuous Infusions:    PRN Meds: acetaminophen **OR** acetaminophen, bisacodyl, diphenhydrAMINE **OR** diphenhydrAMINE, HYDROmorphone, iopamidol, LORazepam, milk and molasses, ondansetron (ZOFRAN) IV, ondansetron **OR** [DISCONTINUED] ondansetron (ZOFRAN) IV, sodium chloride flush  Physical Exam         Weak frail elderly lady resting in bed, sleepy but arousable. No distress S1-S2 Clear breath sounds anteriorly Abdomen firm, mildly distended-patient went for paracenteses on 9-8, globally tender to  palpation  Extremities warm to touch no edema Awake alert oriented  Vital Signs: BP (!) 141/99 (BP Location: Left Arm)   Pulse (!) 110   Temp 98.4 F (36.9 C) (Oral)   Resp 16   Ht 5' 2"  (1.575 m)   Wt 47.7 kg (105 lb 2.6 oz)   SpO2 98%   BMI 19.23 kg/m  SpO2: SpO2: 98 % O2 Device: O2 Device: Nasal Cannula O2 Flow Rate: O2 Flow Rate (L/min): 1.5 L/min  Intake/output summary:   Intake/Output Summary (Last 24 hours) at 02/13/16 1341 Last data filed at  02/13/16 0931  Gross per 24 hour  Intake              240 ml  Output              350 ml  Net             -110 ml   LBM: Last BM Date: 02/11/16 Baseline Weight: Weight: 41.7 kg (92 lb) Most recent weight: Weight: 47.7 kg (105 lb 2.6 oz)       Palliative Assessment/Data:    Flowsheet Rows   Flowsheet Row Most Recent Value  Intake Tab  Referral Department  Hospitalist  Unit at Time of Referral  Oncology Unit  Palliative Care Primary Diagnosis  Cancer  Date Notified  02/06/16  Palliative Care Type  Return patient Palliative Care  Reason for referral  Pain, Non-pain Symptom, Clarify Goals of Care  Date of Admission  02/05/16  Date first seen by Palliative Care  02/07/16  # of days IP prior to Palliative referral  1  Clinical Assessment  Palliative Performance Scale Score  30%  Pain Max last 24 hours  6  Pain Min Last 24 hours  4  Dyspnea Max Last 24 Hours  4  Dyspnea Min Last 24 hours  3  Nausea Max Last 24 Hours  4  Nausea Min Last 24 Hours  3  Anxiety Max Last 24 Hours  5  Anxiety Min Last 24 Hours  4  Psychosocial & Spiritual Assessment  Palliative Care Outcomes  Patient/Family meeting held?  Yes  Who was at the meeting?  patient daughter   Palliative Care follow-up planned  Yes, Home      Patient Active Problem List   Diagnosis Date Noted  . Bilious vomiting with nausea   . Metastasis from gastric cancer ([place])   . Goals of care, counseling/discussion   . DNR (do not resuscitate)   . Ascites   .  Encounter for palliative care   . Abdominal pain 02/06/2016  . UTI (lower urinary tract infection) 02/06/2016  . Absolute anemia 02/06/2016  . Gastric adenocarcinoma ([place]) 02/06/2016  . Cancer related pain   . 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  Dr. Venetia Constable increased basal rate to 89m this AM.  I agee with this change.  Would recommend discharge on current settings with hospice to further titrate on discharge.    Patient with existential suffering but understands poor prognosis.  She has been anxious and this has been complicating her pain.  Ativan as needed as well as zyprexa for anxiety.  Nausea improved somewhat. Continue reglan every 6 hours and zyprexa QHS.  Recommend placement of pleurex prior to d/c for management of recurrent ascites with home hospice.  Discussed immunotherapy with Dr. FBurr Medicoon 9/14.  Tests pending, however chance of being responsive to immunotherapy is low (single digit percentage)   Continue current bowel regimen, patient is now having good bowel movements  Goals of care discussions: Ongoing discussions  with patient and family regarding home with hospice on D/C. Patient has selected hospice agency who can provide CADD for pain management, but still determining physical address where daughter [name] will be primary caregiver.  Home with  hospice once pleurex placed and CADD pump set up.   Code Status:    Code Status Orders        Start     Ordered   02/08/16 1058  Do not attempt resuscitation (DNR)  Continuous    Question Answer Comment  In the event of cardiac or respiratory ARREST Do not call a "code blue"   In the event of cardiac or respiratory ARREST Do not perform Intubation, CPR, defibrillation or ACLS   In the event of cardiac or respiratory ARREST Use medication by any route, position, wound care, and other measures to relive pain and suffering. May use oxygen, suction and manual treatment of airway obstruction as needed for comfort.      02/08/16 1059    Code Status History    Date Active Date Inactive Code Status Order ID Comments User Context   02/06/2016  6:21 AM 02/08/2016 10:59 AM Full Code [id number]  Rise Patience, MD ED       Prognosis: - Her prognosis is certainly less than 6 months and she should qualify for home hospice.  Based on encounters over past couple of days, she appears to be declining quickly.    Discharge Planning: Home with Hospice.  Her goal is still to transition out of the hospital to someone's home until she is closer to death and eventually transition to residential hospice. Patient has no home locally.  Family has chosen [facility] for hospice services and will let case mgt know address to facilitate discharge once it is determined where she will go.  Care plan was discussed with  Patient, Dr Venetia Constable, Care management, Hospice liaison  Thank you for allowing the Palliative Medicine Team to assist in the care of this patient.   Time In:  1000 Time Out: 1025 Total Time 25 Prolonged Time Billed  no       Greater than 50%  of this time was spent counseling and coordinating care related to the above assessment and plan.  Micheline Rough, MD  Please contact Palliative Medicine Team phone at [phone] for questions and concerns.

## 2016-02-13 NOTE — Consult Note (Signed)
Chief Complaint: malignant ascites  Referring Physician:Dr. Charlynne Cousins  Supervising Physician: Daryll Brod  Patient Status: In-pt   HPI: Selena Hayes is an 55 y.o. female with a history of metastatic gastric cancer who has undergone resection and ultimately further surgery secondary to complications.  She has satge IV disease with evidence of peritoneal carcinomatosis.  She has undergone chemotherapy in [place].  She is in the process of relocating here to [place] from [facility] with her family.  She is currently very weak and a poor candidate for chemotherapy due to her poor functional status.  She underwent a paracentesis on 02-07-16 and 2.8L was removed.  Cytology was sent and confirmed metastatic adenocarcinoma cells present.  The patient had a repeat ultrasound done yesterday which confirmed recurrence of abdominal ascites. The patient continues to have significant abdominal pain. A request has been made for a Pleurx catheter.  Past Medical History:  Past Medical History:  Diagnosis Date  . Cancer (HCC)    Stomach, Skin  . Tremors of nervous system     Past Surgical History:  Past Surgical History:  Procedure Laterality Date  . GASTRECTOMY    . TUBAL LIGATION      Family History:  Family History  Problem Relation Age of Onset  . CAD Mother   . Lung cancer Father   . Diabetes Brother     Social History:  reports that she has never smoked. She has never used smokeless tobacco. She reports that she does not drink alcohol or use drugs.  Allergies:  Allergies  Allergen Reactions  . Keflex [Cephalexin] Hives    Medications: Medications have been reviewed in Epic  Please HPI for pertinent positives, otherwise complete 10 system ROS negative.  Mallampati Score: MD Evaluation Airway: WNL Heart: WNL Abdomen: WNL Chest/ Lungs: WNL ASA  Classification: 3 Mallampati/Airway Score: Two  Physical Exam: BP (!) 141/99 (BP Location: Left Arm)   Pulse (!) 110   Temp  98.4 F (36.9 C) (Oral)   Resp 16   Ht 5\' 2"  (1.575 m)   Wt 105 lb 2.6 oz (47.7 kg)   SpO2 98%   BMI 19.23 kg/m  Body mass index is 19.23 kg/m. General: Frail appearing, cachectic white female who is laying in bed in NAD HEENT: head has a bandanna present to cover up the fact that she has no hair.  Sclera are noninjected.  PERRL.  Ears and nose without any masses or lesions.  Mouth is pink and moist Heart: regular, rate, and rhythm.  Normal s1,s2. No obvious murmurs, gallops, or rubs noted.  Palpable radial and pedal pulses bilaterally Lungs/chest: CTAB, no wheezes, rhonchi, or rales noted.  Respiratory effort nonlabored.  Port-A-Cath is present in the right upper chest. Abd: soft, diffusely tender, distended, hypoactive BS, no masses, hernias, or organomegaly MS: all 4 extremities are symmetrical with no cyanosis, clubbing, or edema. Psych: A&Ox3 with an appropriate affect.   Labs: Results for orders placed or performed during the hospital encounter of 02/05/16 (from the past 48 hour(s))  Glucose, capillary     Status: Abnormal   Collection Time: 02/11/16  4:02 PM  Result Value Ref Range   Glucose-Capillary 106 (H) 65 - 99 mg/dL  Glucose, capillary     Status: Abnormal   Collection Time: 02/11/16 11:49 PM  Result Value Ref Range   Glucose-Capillary 119 (H) 65 - 99 mg/dL  Glucose, capillary     Status: Abnormal   Collection Time: 02/12/16  7:38 AM  Result Value Ref Range   Glucose-Capillary 111 (H) 65 - 99 mg/dL   Comment 1 Notify RN    Comment 2 Document in Chart   Glucose, capillary     Status: None   Collection Time: 02/12/16  4:18 PM  Result Value Ref Range   Glucose-Capillary 89 65 - 99 mg/dL   Comment 1 Notify RN    Comment 2 Document in Chart   Glucose, capillary     Status: None   Collection Time: 02/13/16 12:44 AM  Result Value Ref Range   Glucose-Capillary 99 65 - 99 mg/dL  Glucose, capillary     Status: Abnormal   Collection Time: 02/13/16  7:33 AM  Result  Value Ref Range   Glucose-Capillary 106 (H) 65 - 99 mg/dL   Comment 1 Notify RN    Comment 2 Document in Chart     Imaging: US Abdomen Limited  Result Date: 02/12/2016 CLINICAL DATA:  Ascites EXAM: LIMITED ABDOMEN ULTRASOUND FOR ASCITES TECHNIQUE: Limited ultrasound survey for ascites was performed in all four abdominal quadrants. COMPARISON:  02/07/2016 FINDINGS: There is a moderate amount of ascites seen in all 4 quadrants. Loculated ascites versus tethered liver margin for example on image 3, and similar to the recent CT scan. IMPRESSION: 1. Moderate amount of ascites in all 4 quadrants. Tethered liver margin would be abnormal for simple ascites and reflects the appearance of peritoneal carcinomatosis. Electronically Signed   By: Van Clines M.D.   On: 02/12/2016 11:56    Assessment/Plan 1. 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 -NPO p MN -not on blood thinners. -check PT/INR in am -Risks and Benefits discussed with the patient including bleeding, infection, damage to adjacent structures, bowel perforation/fistula connection, and sepsis. All of the patient's questions were answered, patient is agreeable to proceed. Consent signed and in chart.   Thank you for this interesting consult.  I greatly enjoyed meeting Selena Hayes and look forward to participating in their care.  A copy of this report was sent to the requesting provider on this date.  Electronically Signed: Henreitta Cea 02/13/2016, 2:06 PM   I spent a total of 40 Minutes  in face to face in clinical consultation, greater than 50% of which was counseling/coordinating care for recurrent malignant ascites

## 2016-02-13 NOTE — Progress Notes (Signed)
[facility] PROGRESS NOTE    Progress Note  Selena Hayes  [id number] DOB: 11-14-1960 DOA: 02/05/2016 PCP: No PCP Per Patient     Brief Narrative:   Selena Hayes is an 55 y.o. 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 Patient received received 3 cycles chemotherapy. She has been having abdominal pain since the surgery, it has getting worse daily, also developed 25 pounds weight loss in the past few months. CT abd showed peritoneal carcinomatosis, ascites   Assessment/Plan:   Intractable Abdominal pain in the setting of peritoneal carcinomatosis: Pain is improved today, but she is clearing 16 mg every 4 hours. Will increase her basal rate. Status post paracentesis on 02/07/2016 yielding 2.8 L, cytology showed malignant adenocarcinoma. Check an abdominal ultrasound as she relates her abdomen is slightly distended. Abdominal ultrasound showed recurrence of the ascites fluid consult IR for drain placement.  Metastatic gastric cancer with peritoneal carcinomatosis: Status post gastrectomy poor prognosis and care on board. Appreciate palliative care assistance, going for peritoneal drain  UTI (lower urinary tract infection) No growth on urine culture completed 3 day course.  Hypokalemia/hypomagnesemia: Repeated monitor.  Constipation: Continue lactulose.  Normocytic anemia:  Gastric adenocarcinoma [facility]) Care was consulted and she will go home with hospice care.   Encounter for palliative care   Goals of care, counseling/discussion   DNR (do not resuscitate)  DVT prophylaxis: lovenox Family Communication:none Disposition Plan/Barrier to D/C: Hopefully home in the morning. Code Status:     Code Status Orders        Start     Ordered    02/08/16 1058  Do not attempt resuscitation (DNR)  Continuous    Question Answer Comment  In the event of cardiac or respiratory ARREST Do not call a "code blue"   In the event of cardiac or respiratory ARREST Do not perform Intubation, CPR, defibrillation or ACLS   In the event of cardiac or respiratory ARREST Use medication by any route, position, wound care, and other measures to relive pain and suffering. May use oxygen, suction and manual treatment of airway obstruction as needed for comfort.      02/08/16 1059    Code Status History    Date Active Date Inactive Code Status Order ID Comments User Context   02/06/2016  6:21 AM 02/08/2016 10:59 AM Full Code [id number]  Rise Patience, MD ED        IV Access:    Peripheral IV   Procedures and diagnostic studies:   US Abdomen Limited  Result Date: 02/12/2016 CLINICAL DATA:  Ascites EXAM: LIMITED ABDOMEN ULTRASOUND FOR ASCITES TECHNIQUE: Limited ultrasound survey for ascites was performed in all four abdominal quadrants. COMPARISON:  02/07/2016 FINDINGS: There is a moderate amount of ascites seen in all 4 quadrants. Loculated ascites versus tethered liver margin for example on image 3, and similar to the recent CT scan. IMPRESSION: 1. Moderate amount of ascites in all 4 quadrants. Tethered liver margin would be abnormal for simple ascites and reflects the appearance of peritoneal carcinomatosis. Electronically Signed   By: Van Clines M.D.   On: 02/12/2016 11:56     Medical Consultants:    None.  Anti-Infectives:   none  Subjective:    Selena Hayes she relates her pain was not well controlled overnight with these changes  make this morning it is improved.  Objective:    Vitals:   02/12/16 0846 02/12/16 1400 02/12/16 2142 02/13/16 0633  BP:  (!) 145/93 (!) 138/105 (!) 141/99  Pulse:  (!) 111 (!) 113 (!) 110  Resp: 13 20 20 16   Temp:  98.5 F (36.9 C) 99 F (37.2 C) 98.4 F (36.9 C)  TempSrc:  Oral Oral  Oral  SpO2: 97% 97% 99% 98%  Weight:      Height:        Intake/Output Summary (Last 24 hours) at 02/13/16 0932 Last data filed at 02/13/16 0117  Gross per 24 hour  Intake                0 ml  Output              500 ml  Net             -500 ml   Filed Weights   02/05/16 2344 02/06/16 0628  Weight: 41.7 kg (92 lb) 47.7 kg (105 lb 2.6 oz)    Exam: General exam: In no acute distress.Cachectic appearing Respiratory system: Good air movement and clear to auscultation. Cardiovascular system: S1 & S2 heard, RRR. Gastrointestinal system: Abdomen is nondistended, soft and nontender.  Central nervous system: Alert and oriented. No focal neurological deficits. Extremities: No pedal edema. Skin: No rashes, lesions or ulcers Psychiatry: Judgement and insight appear normal. Mood & affect appropriate.    Data Reviewed:    Labs: Basic Metabolic Panel:  Recent Labs Lab 02/07/16 0510 02/09/16 0410 02/09/16 0412 02/10/16 0415 02/11/16 0600  NA 138  --  140 136 134*  K 3.4*  --  3.1* 4.0 4.1  CL 105  --  109 101 97*  CO2 27  --  25 27 28   GLUCOSE 118*  --  282* 123* 117*  BUN 9  --  6 9 10   CREATININE 0.44  --  0.89 0.47 0.60  CALCIUM 8.6*  --  7.6* 8.5* 8.8*  MG  --  1.3*  --  1.9 1.7   GFR Estimated Creatinine Clearance: 59.1 mL/min (by C-G formula based on SCr of 0.6 mg/dL). Liver Function Tests:  Recent Labs Lab 02/07/16 0510  AST 15  ALT 13*  ALKPHOS 60  BILITOT 0.4  PROT 5.8*  ALBUMIN 2.3*   No results for input(s): LIPASE, AMYLASE in the last 168 hours. No results for input(s): AMMONIA in the last 168 hours. Coagulation profile No results for input(s): INR, PROTIME in the last 168 hours.  CBC:  Recent Labs Lab 02/07/16 0510 02/09/16 0412  WBC 5.5 5.8  HGB 10.0* 10.2*  HCT 30.1* 30.7*  MCV 89.6 89.8  PLT 318 277   Cardiac Enzymes: No results for input(s): CKTOTAL, CKMB, CKMBINDEX, TROPONINI in the last 168 hours. BNP (last 3 results) No results  for input(s): PROBNP in the last 8760 hours. CBG:  Recent Labs Lab 02/11/16 2349 02/12/16 0738 02/12/16 1618 02/13/16 0044 02/13/16 0733  GLUCAP 119* 111* 89 99 106*   D-Dimer: No results for input(s): DDIMER in the last 72 hours. Hgb A1c: No results for input(s): HGBA1C in the last 72 hours. Lipid Profile: No results for input(s): CHOL, HDL, LDLCALC, TRIG, CHOLHDL, LDLDIRECT in the last 72 hours. Thyroid function studies: No results for input(s): TSH, T4TOTAL, T3FREE, THYROIDAB in the last 72 hours.  Invalid input(s): FREET3 Anemia work up: No results for input(s): VITAMINB12, FOLATE, FERRITIN, TIBC, IRON, RETICCTPCT in the last 15  hours. Sepsis Labs:  Recent Labs Lab 02/07/16 0510 02/09/16 0412  WBC 5.5 5.8   Microbiology Recent Results (from the past 240 hour(s))  Blood culture (routine x 2)     Status: None   Collection Time: 02/06/16 12:06 AM  Result Value Ref Range Status   Specimen Description BLOOD PORT  Final   Special Requests BOTTLES DRAWN AEROBIC AND ANAEROBIC 5ML EA  Final   Culture   Final    NO GROWTH 5 DAYS Performed at [facility]    Report Status 02/11/2016 FINAL  Final  Blood culture (routine x 2)     Status: None   Collection Time: 02/06/16 12:38 AM  Result Value Ref Range Status   Specimen Description BLOOD BLOOD RIGHT FOREARM  Final   Special Requests BOTTLES DRAWN AEROBIC AND ANAEROBIC 5ML EA  Final   Culture   Final    NO GROWTH 5 DAYS Performed at [facility]    Report Status 02/11/2016 FINAL  Final  Culture, Urine     Status: None   Collection Time: 02/06/16  2:40 AM  Result Value Ref Range Status   Specimen Description URINE, RANDOM  Final   Special Requests NONE  Final   Culture NO GROWTH Performed at [facility]   Final   Report Status 02/08/2016 FINAL  Final  Culture, body fluid-bottle     Status: None   Collection Time: 02/07/16  3:12 PM  Result Value Ref Range Status   Specimen Description  PERITONEAL  Final   Special Requests NONE  Final   Culture NO GROWTH 5 DAYS  Final   Report Status 02/12/2016 FINAL  Final  Gram stain     Status: None   Collection Time: 02/07/16  3:12 PM  Result Value Ref Range Status   Specimen Description PERITONEAL  Final   Special Requests NONE  Final   Gram Stain   Final    WBC PRESENT, PREDOMINANTLY MONONUCLEAR NO ORGANISMS SEEN CYTOSPIN SMEAR    Report Status 02/07/2016 FINAL  Final     Medications:   . feeding supplement  1 Container Oral TID BM  . HYDROmorphone   Intravenous Q4H  . lactulose  20 g Oral TID  . metoCLOPramide (REGLAN) injection  5 mg Intravenous Q6H  . milk and molasses  1 enema Rectal Once  . nicotine  14 mg Transdermal Daily  . OLANZapine  5 mg Oral QHS  . pantoprazole  40 mg Oral BID  . polyethylene glycol  17 g Oral Daily  . senna  2 tablet Oral QHS   Continuous Infusions:   Time spent: 15 min   LOS: 7 days   Charlynne Cousins  [facility] Pager [phone]  *Please refer to [email or web address], password [email or web address] to get updated schedule on who will round on this patient, as hospitalists switch teams weekly. If 7PM-7AM, please contact night-coverage at [email or web address], password [email or web address] for any overnight needs.  02/13/2016, 9:32 AM

## 2016-02-13 NOTE — Progress Notes (Signed)
Nutrition Brief Note  Patient identified on the Malnutrition Screening Tool (MST) Report  Patient working closely with palliative medicine and will discharge home with hospice.   Wt Readings from Last 15 Encounters:  02/06/16 105 lb 2.6 oz (47.7 kg)    Body mass index is 19.23 kg/m. Patient meets criteria for normal based on current BMI.   Current diet order is regular, patient is consuming approximately 25% of meals at this time. Labs and medications reviewed.   No nutrition interventions warranted at this time. If nutrition issues arise, please consult RD.   Clayton Bibles, MS, RD, LDN Pager: [phone] After Hours Pager: [phone]

## 2016-02-14 ENCOUNTER — Inpatient Hospital Stay (HOSPITAL_COMMUNITY): Payer: Medicaid - Out of State

## 2016-02-14 LAB — GLUCOSE, CAPILLARY
GLUCOSE-CAPILLARY: 108 mg/dL — AB (ref 65–99)
Glucose-Capillary: 100 mg/dL — ABNORMAL HIGH (ref 65–99)
Glucose-Capillary: 127 mg/dL — ABNORMAL HIGH (ref 65–99)

## 2016-02-14 LAB — PROTIME-INR
INR: 2.5
INR: 2.65
PROTHROMBIN TIME: 27.5 s — AB (ref 11.4–15.2)
PROTHROMBIN TIME: 28.8 s — AB (ref 11.4–15.2)

## 2016-02-14 MED ORDER — HYDROMORPHONE 1 MG/ML IV SOLN
INTRAVENOUS | Status: DC
  Administered 2016-02-14: 19:00:00 via INTRAVENOUS
  Administered 2016-02-14: 12.11 mg via INTRAVENOUS
  Administered 2016-02-14: 19.45 mg via INTRAVENOUS
  Administered 2016-02-14: 13.95 mg via INTRAVENOUS
  Administered 2016-02-15: 11.16 mg via INTRAVENOUS
  Administered 2016-02-15: 08:00:00 via INTRAVENOUS
  Administered 2016-02-15: 18.75 mg via INTRAVENOUS
  Administered 2016-02-15: via INTRAVENOUS
  Filled 2016-02-14 (×3): qty 25

## 2016-02-14 NOTE — Progress Notes (Signed)
After speaking with pt and daughter [name] at bedside, pt agrees to residential hospice placement. CSW made aware and consult placed. Marney Doctor RN,BSN,NCM [phone]

## 2016-02-14 NOTE — Progress Notes (Signed)
[facility] PROGRESS NOTE    Progress Note  Selena Hayes  [id number] DOB: [birth year] DOA: 02/05/2016 PCP: No PCP Per Patient     Brief Narrative:   Selena Hayes is an 55 y.o. 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 for abdominal pain. Patient received received 3 cycles chemotherapy. She has been having abdominal pain since the surgery, it has getting worse daily, also developed 25 pounds weight loss in the past few months. CT abd showed peritoneal carcinomatosis, ascites   Assessment/Plan:   Intractable Abdominal pain in the setting of peritoneal carcinomatosis: Pain not control her pain will increase her basal rate. Status post paracentesis on 02/07/2016 yielding 2.8 L, cytology showed malignant adenocarcinoma. Check an abdominal ultrasound as she relates her abdomen is slightly distended. Drain not able to be placed due to coagulopathy, most likely due to liver involvement. Appreciate PMT assistance.  Metastatic gastric cancer with peritoneal carcinomatosis: UTI (lower urinary tract infection) Hypokalemia/hypomagnesemia: Constipation: Normocytic anemia: Gastric adenocarcinoma ([place]) Encounter for palliative care  DNR (do not resuscitate)  DVT prophylaxis: lovenox Family Communication:none Disposition Plan/Barrier to D/C: Residential hospice in 1-2 days Code Status:     Code Status Orders        Start     Ordered   02/08/16 1058  Do not attempt resuscitation (DNR)  Continuous    Question Answer Comment  In the event of cardiac or respiratory ARREST Do not call a "code blue"   In the event of cardiac or respiratory ARREST Do not perform Intubation, CPR, defibrillation or ACLS   In the event of cardiac or respiratory ARREST Use medication by any  route, position, wound care, and other measures to relive pain and suffering. May use oxygen, suction and manual treatment of airway obstruction as needed for comfort.      02/08/16 1059    Code Status History    Date Active Date Inactive Code Status Order ID Comments User Context   02/06/2016  6:21 AM 02/08/2016 10:59 AM Full Code [id number]  Rise Patience, MD ED        IV Access:    Peripheral IV   Procedures and diagnostic studies:   No results found.   Medical Consultants:    None.  Anti-Infectives:   none  Subjective:    Selena Hayes she relates her pain was well controlled overnight.  Objective:    Vitals:   02/13/16 0633 02/13/16 1405 02/13/16 2131 02/14/16 0720  BP: (!) 141/99 (!) 130/95 (!) 149/109 113/81  Pulse: (!) 110 (!) 117 (!) 127 (!) 124  Resp: 16 16  16   Temp: 98.4 F (36.9 C) 98 F (36.7 C) 98.3 F (36.8 C)   TempSrc: Oral Oral Axillary   SpO2: 98% 98% 95% 91%  Weight:      Height:        Intake/Output Summary (Last 24 hours) at 02/14/16 1325 Last data filed at 02/14/16 0249  Gross per 24 hour  Intake                0 ml  Output              400 ml  Net             -400 ml   Filed Weights   02/05/16 2344 02/06/16 [id number]  Weight: 41.7 kg (92 lb) 47.7 kg (105 lb 2.6 oz)    Exam: General exam: In no acute distress.Cachectic appearing Respiratory system: Good air movement and clear to auscultation. Cardiovascular system: S1 & S2 heard, RRR. Gastrointestinal system: Abdomen is nondistended, soft and nontender.  Central nervous system: Alert and oriented. No focal neurological deficits. Extremities: No pedal edema. Skin: No rashes, lesions or ulcers Psychiatry: Judgement and insight appear normal. Mood & affect appropriate.    Data Reviewed:    Labs: Basic Metabolic Panel:  Recent Labs Lab 02/09/16 0410  02/09/16 0412 02/10/16 0415 02/11/16 0600  NA  --   --  140 136 134*  K  --   < > 3.1* 4.0 4.1  CL  --   --   109 101 97*  CO2  --   --  25 27 28   GLUCOSE  --   --  282* 123* 117*  BUN  --   --  6 9 10   CREATININE  --   --  0.89 0.47 0.60  CALCIUM  --   --  7.6* 8.5* 8.8*  MG 1.3*  --   --  1.9 1.7  < > = values in this interval not displayed. GFR Estimated Creatinine Clearance: 59.1 mL/min (by C-G formula based on SCr of 0.6 mg/dL). Liver Function Tests: No results for input(s): AST, ALT, ALKPHOS, BILITOT, PROT, ALBUMIN in the last 168 hours. No results for input(s): LIPASE, AMYLASE in the last 168 hours. No results for input(s): AMMONIA in the last 168 hours. Coagulation profile  Recent Labs Lab 02/14/16 0500  INR 2.50    CBC:  Recent Labs Lab 02/09/16 0412  WBC 5.8  HGB 10.2*  HCT 30.7*  MCV 89.8  PLT 277   Cardiac Enzymes: No results for input(s): CKTOTAL, CKMB, CKMBINDEX, TROPONINI in the last 168 hours. BNP (last 3 results) No results for input(s): PROBNP in the last 8760 hours. CBG:  Recent Labs Lab 02/13/16 0044 02/13/16 0733 02/13/16 1723 02/14/16 0019 02/14/16 0732  GLUCAP 99 106* 134* 127* 100*   D-Dimer: No results for input(s): DDIMER in the last 72 hours. Hgb A1c: No results for input(s): HGBA1C in the last 72 hours. Lipid Profile: No results for input(s): CHOL, HDL, LDLCALC, TRIG, CHOLHDL, LDLDIRECT in the last 72 hours. Thyroid function studies: No results for input(s): TSH, T4TOTAL, T3FREE, THYROIDAB in the last 72 hours.  Invalid input(s): FREET3 Anemia work up: No results for input(s): VITAMINB12, FOLATE, FERRITIN, TIBC, IRON, RETICCTPCT in the last 72 hours. Sepsis Labs:  Recent Labs Lab 02/09/16 0412  WBC 5.8   Microbiology Recent Results (from the past 240 hour(s))  Blood culture (routine x 2)     Status: None   Collection Time: 02/06/16 12:06 AM  Result Value Ref Range Status   Specimen Description BLOOD PORT  Final   Special Requests BOTTLES DRAWN AEROBIC AND ANAEROBIC 5ML EA  Final   Culture   Final    NO GROWTH 5  DAYS Performed at [facility]    Report Status 02/11/2016 FINAL  Final  Blood culture (routine x 2)     Status: None   Collection Time: 02/06/16 12:38 AM  Result Value Ref Range Status   Specimen Description BLOOD BLOOD RIGHT FOREARM  Final   Special Requests BOTTLES DRAWN AEROBIC AND ANAEROBIC 5ML EA  Final   Culture   Final    NO GROWTH 5 DAYS Performed at [facility]    Report  Status 02/11/2016 FINAL  Final  Culture, Urine     Status: None   Collection Time: 02/06/16  2:40 AM  Result Value Ref Range Status   Specimen Description URINE, RANDOM  Final   Special Requests NONE  Final   Culture NO GROWTH Performed at [facility]   Final   Report Status 02/08/2016 FINAL  Final  Culture, body fluid-bottle     Status: None   Collection Time: 02/07/16  3:12 PM  Result Value Ref Range Status   Specimen Description PERITONEAL  Final   Special Requests NONE  Final   Culture NO GROWTH 5 DAYS  Final   Report Status 02/12/2016 FINAL  Final  Gram stain     Status: None   Collection Time: 02/07/16  3:12 PM  Result Value Ref Range Status   Specimen Description PERITONEAL  Final   Special Requests NONE  Final   Gram Stain   Final    WBC PRESENT, PREDOMINANTLY MONONUCLEAR NO ORGANISMS SEEN CYTOSPIN SMEAR    Report Status 02/07/2016 FINAL  Final     Medications:   . feeding supplement  1 Container Oral TID BM  . HYDROmorphone   Intravenous Q4H  . lactulose  20 g Oral TID  . metoCLOPramide (REGLAN) injection  5 mg Intravenous Q6H  . milk and molasses  1 enema Rectal Once  . nicotine  14 mg Transdermal Daily  . OLANZapine  5 mg Oral QHS  . pantoprazole  40 mg Oral BID  . polyethylene glycol  17 g Oral Daily  . senna  2 tablet Oral QHS  . vancomycin  1,000 mg Intravenous to XRAY   Continuous Infusions:   Time spent: 15 min   LOS: 8 days   Charlynne Cousins  [facility] Pager [phone]  *Please refer to [email or web address], password [email or web address] to get  updated schedule on who will round on this patient, as hospitalists switch teams weekly. If 7PM-7AM, please contact night-coverage at [email or web address], password [email or web address] for any overnight needs.  02/14/2016, 1:25 PM

## 2016-02-14 NOTE — Progress Notes (Signed)
Daily Progress Note   Patient Name: Selena Hayes       Date: 02/14/2016 DOB: [birth year]  Age: 55 y.o. MRN#: [id number] Attending Physician: Charlynne Cousins, MD Primary Care Physician: No PCP Per Patient Admit Date: 02/05/2016  Reason for Consultation/Follow-up: Establishing goals of care  Life limiting illness of metastatic gastric cancer Subjective: Met with patient in conjunction with brother and daughter.  Patient in bed with family present today. She appears more frail each day.   Her pain is well controlled on current regimen.  She was unable to get pleurex placed today.     Her daughter spoke with myself and case mgr in the hall and relayed that prior plan to stay in [place] has fallen through and she has been desperately working to rent an apartment or home where she can care for her mother with the support of hospice.  She tried talking with her mother about going to residential hospice, at least until she can establish a residence in the area, and patient refused to consided and swore at [place] her until she left room crying.  See below:  Length of Stay: 8  Current Medications: Scheduled Meds:  . feeding supplement  1 Container Oral TID BM  . HYDROmorphone   Intravenous Q4H  . lactulose  20 g Oral TID  . metoCLOPramide (REGLAN) injection  5 mg Intravenous Q6H  . milk and molasses  1 enema Rectal Once  . nicotine  14 mg Transdermal Daily  . OLANZapine  5 mg Oral QHS  . pantoprazole  40 mg Oral BID  . polyethylene glycol  17 g Oral Daily  . senna  2 tablet Oral QHS  . vancomycin  1,000 mg Intravenous to XRAY    Continuous Infusions:    PRN Meds: acetaminophen **OR** acetaminophen, bisacodyl, diphenhydrAMINE **OR** diphenhydrAMINE, HYDROmorphone, iopamidol,  LORazepam, milk and molasses, ondansetron (ZOFRAN) IV, ondansetron **OR** [DISCONTINUED] ondansetron (ZOFRAN) IV, promethazine, sodium chloride flush  Physical Exam         Weak frail elderly lady resting in bed, sleepy but arousable. No distress S1-S2 Clear breath sounds anteriorly Abdomen firm, mildly distended-patient went for paracenteses on 9-8, globally tender to palpation  Extremities warm to touch no edema Awake alert oriented  Vital Signs: BP 113/81 (BP Location: Right Arm)   Pulse (!) 124  Temp 98.3 F (36.8 C) (Axillary)   Resp 16   Ht 5' 2"  (1.575 m)   Wt 47.7 kg (105 lb 2.6 oz)   SpO2 91%   BMI 19.23 kg/m  SpO2: SpO2: 91 % O2 Device: O2 Device: Nasal Cannula O2 Flow Rate: O2 Flow Rate (L/min): 1.5 L/min  Intake/output summary:   Intake/Output Summary (Last 24 hours) at 02/14/16 1259 Last data filed at 02/14/16 0249  Gross per 24 hour  Intake                0 ml  Output              400 ml  Net             -400 ml   LBM: Last BM Date: 02/13/16 Baseline Weight: Weight: 41.7 kg (92 lb) Most recent weight: Weight: 47.7 kg (105 lb 2.6 oz)       Palliative Assessment/Data:    Flowsheet Rows   Flowsheet Row Most Recent Value  Intake Tab  Referral Department  Hospitalist  Unit at Time of Referral  Oncology Unit  Palliative Care Primary Diagnosis  Cancer  Date Notified  02/06/16  Palliative Care Type  Return patient Palliative Care  Reason for referral  Pain, Non-pain Symptom, Clarify Goals of Care  Date of Admission  02/05/16  Date first seen by Palliative Care  02/07/16  # of days IP prior to Palliative referral  1  Clinical Assessment  Palliative Performance Scale Score  30%  Pain Max last 24 hours  6  Pain Min Last 24 hours  4  Dyspnea Max Last 24 Hours  4  Dyspnea Min Last 24 hours  3  Nausea Max Last 24 Hours  4  Nausea Min Last 24 Hours  3  Anxiety Max Last 24 Hours  5  Anxiety Min Last 24 Hours  4  Psychosocial & Spiritual Assessment    Palliative Care Outcomes  Patient/Family meeting held?  Yes  Who was at the meeting?  patient daughter   Palliative Care follow-up planned  Yes, Home      Patient Active Problem List   Diagnosis Date Noted  . Bilious vomiting with nausea   . 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 Goals of care, counseling/discussion   . DNR (do not resuscitate)   . Malignant ascites   . Encounter for palliative care   . Abdominal pain 02/06/2016  . UTI (lower urinary tract infection) 02/06/2016  . Absolute anemia 02/06/2016  . Gastric adenocarcinoma ([place]) 02/06/2016  . Cancer related pain   . 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  Pain well controlled on current regimen.  Would recommend discharge on current settings with hospice to further titrate on discharge if a safe discharge option can be arranged.    Patient with existential suffering but understands poor prognosis.  She has been anxious and this has been complicating her pain.  Ativan as needed as well  as continue zyprexa for anxiety.  Nausea improved somewhat. Continue reglan every 6 hours and zyprexa QHS.  Unfortunately, her INR is elevated and she was unable to get Pleurex today.  She is not on anticoagulation and elevated INR likely sequelae of her disease.  I explained to her that this would likely preclude  placement of pleurex and plan in that case is to manage symptoms medically.  Discussed immunotherapy with Dr. Burr Medico on 9/14.  Tests pending, however chance of being responsive to immunotherapy is low (single digit percentage)   Continue current bowel regimen, patient is now having good bowel movements  Goals of care discussions: Ongoing discussions with patient and family regarding hospice on D/C. Patient has selected hospice agency who can provide CADD for pain management, but still determining physical address where daughter [name] will be primary caregiver.  Patient has been refusing to consider any other discharge (residential hospice) and therefore no safe option for discharge until she can establish residence where hospice support can be provided.  Discussed this as well as recommendation for residential hospice placement at length with patient and family today in conjunction with case manager.   Code Status:    Code Status Orders        Start     Ordered   02/08/16 1058  Do not attempt resuscitation (DNR)  Continuous    Question Answer Comment  In the event of cardiac or respiratory ARREST Do not call a "code blue"   In the event of cardiac or respiratory ARREST Do not perform Intubation, CPR, defibrillation or ACLS   In the event of cardiac or respiratory ARREST Use medication by any route, position, wound care, and other measures to relive pain and suffering. May use oxygen, suction and manual treatment of airway obstruction as needed for comfort.      02/08/16 1059    Code Status History    Date Active Date Inactive Code Status Order ID Comments User Context   02/06/2016  6:21 AM 02/08/2016 10:59 AM Full Code [id number]  Rise Patience, MD ED       Prognosis: - Patient continues to decline. She appears to be progressing quickly and I feel based upon her course over the last 3 days, her prognosis is likely less than 2 weeks.  Discharge Planning: Home with Hospice has been  the goal. Her daughter has been trying to find a residence to rent where she can take her mother with the support of home hospice as patient has no home.  The prior plan to live with her former daughter in law is no longer an option.  She would be well served by residential hospice and I discussed in conjunction with case manager with this patient regarding pursuing residential hospice placement, even if it is temporarily while finding a home to rent.  As of now, she has been refusing to consider residential hospice placement.  The only options for discharge that will serve her well are home with hospice vs residential hospice placement.    As she is is only willing to consider discharge home with home hospice and she does not have a residence where hospice care can be provided, there is not a safe option for discharge at this time.    Care plan was discussed with  Patient, daughter [name] Dr Venetia Constable, Care management  Thank you for allowing the Palliative Medicine Team to assist in the care of this patient.   Time In: 1210 Time Out: 1300 Total  Time 50 Prolonged Time Billed  no       Greater than 50%  of this time was spent counseling and coordinating care related to the above assessment and plan.  Micheline Rough, MD  Please contact Palliative Medicine Team phone at [phone] for questions and concerns.

## 2016-02-14 NOTE — Progress Notes (Signed)
[facility] RN note for [employer] received from [place] of family interest in [place].  Chart is currently under review for eligibilty and availability. Unfortunately at this time there is not a bed available at [facility].    Thank you for the referral.  [facility] will follow up in the event that a bed does become available over the weekend.   Mickie Kay, [facility] Liaison  [phone]

## 2016-02-14 NOTE — Progress Notes (Signed)
Patient ID: Selena Hayes, female   DOB: 26-Nov-1960, 55 y.o.   MRN: [id number] Pt presented to IR today for abd tunneled cath placement for ascites. Latest PT/INR was 27.5/2.5 which is too high to proceed with tunneled cath insertion. Procedure postponed for now. Pt does not wish to undergo paracentesis today as she is not significantly symptomatic.Will repeat PT/INR this afternoon to verify elevation.

## 2016-02-14 NOTE — Clinical Social Work Note (Signed)
Clinical Social Work Assessment  Patient Details  Name: Selena Hayes MRN: [id number] Date of Birth: [birth year]  Date of referral:  02/14/16               Reason for consult:  Discharge Planning                Permission sought to share information with:  Facility Art therapist granted to share information::  Yes, Verbal Permission Granted  Name::        Agency::     Relationship::     Contact Information:     Housing/Transportation Living arrangements for the past 2 months:  Single Family Home Source of Information:  Patient, Palliative Care Team, Adult Children Patient Interpreter Needed:  None Criminal Activity/Legal Involvement Pertinent to Current Situation/Hospitalization:  No - Comment as needed Significant Relationships:  Adult Children Lives with:  Adult Children Do you feel safe going back to the place where you live?   (Residential Hospice Home recommended.) Need for family participation in patient care:  Yes (Comment)  Care giving concerns:  Daughter is unable to provide end of life care at home.   Social Worker assessment / plan:  Pt hospitalized on 02/05/16 with abdominal pain with constipation. Pt has also has gastric cancer with mets. Palliative care Team has been assisting with [place] and support. Pt / family has accepted recommendation for residential hospice home placement and has requested [name]. CSW has left a message for [facility] liaison with referral info. Awaiting return call. Pt / family will consider [facility] if [facility] has no availability. CSW will continue to follow to assist with d/c planning needs. Employment status:    Insurance information:  Catering manager PT Recommendations:  Not assessed at this time Information / Referral to community resources:  Other (Comment Required) (Residential Hospice Homes)  Patient/Family's Response to care: Pt / family have agreed with plan for Residential Hospice Home  placement.  Patient/Family's Understanding of and Emotional Response to Diagnosis, Current Treatment, and Prognosis:  Pt / family are aware of pt's medical status and poor prognosis. Palliative care has been providing ongoing support. Pt / family are struggling to come to terms with pt's prognosis. Pt has reluctantly agreed to residential hospice home placement.   Emotional Assessment Appearance:  Appears older than stated age Attitude/Demeanor/Rapport:  Other (cooperative) Affect (typically observed):  Calm Orientation:  Oriented to Self, Oriented to Place, Oriented to  Time, Oriented to Situation Alcohol / Substance use:  Not Applicable Psych involvement (Current and /or in the community):  No (Comment)  Discharge Needs  Concerns to be addressed:  No discharge needs identified Readmission within the last 30 days:  No Current discharge risk:    Barriers to Discharge:  No Barriers Identified   Loraine Maple  [id number] 02/14/2016, 3:45 PM

## 2016-02-15 LAB — GLUCOSE, CAPILLARY: GLUCOSE-CAPILLARY: 118 mg/dL — AB (ref 65–99)

## 2016-02-15 MED ORDER — HYDROMORPHONE 1 MG/ML IV SOLN
INTRAVENOUS | Status: DC
Start: 2016-02-15 — End: 2016-02-16
  Administered 2016-02-15: 22.67 mg via INTRAVENOUS
  Administered 2016-02-15: 14:00:00 via INTRAVENOUS
  Administered 2016-02-15: 10.79 mg via INTRAVENOUS
  Administered 2016-02-15: 18:00:00 via INTRAVENOUS
  Administered 2016-02-15: 16.75 mg via INTRAVENOUS
  Administered 2016-02-16: 10.42 mg via INTRAVENOUS
  Administered 2016-02-16: 13:00:00 via INTRAVENOUS
  Administered 2016-02-16: 25.86 mg via INTRAVENOUS
  Administered 2016-02-16: 01:00:00 via INTRAVENOUS
  Administered 2016-02-16: 13.65 mg via INTRAVENOUS
  Administered 2016-02-16: 20.25 mg via INTRAVENOUS
  Filled 2016-02-15 (×5): qty 25

## 2016-02-15 MED ORDER — HYDROMORPHONE 1 MG/ML IV SOLN: 4.5000 mg | INTRAVENOUS | Status: DC

## 2016-02-15 MED ORDER — HYDROMORPHONE BOLUS VIA INFUSION: 1.0000 mg | INTRAVENOUS | 0 refills | Status: AC | PRN

## 2016-02-15 MED ORDER — HYDROMORPHONE 1 MG/ML IV SOLN: 5.0000 mg | INTRAVENOUS | Status: DC

## 2016-02-15 MED ORDER — OLANZAPINE 5 MG PO TABS: 5.0000 mg | ORAL_TABLET | Freq: Every day | ORAL | Status: AC

## 2016-02-15 NOTE — Progress Notes (Signed)
Daily Progress Note   Patient Name: Selena Hayes       Date: 02/15/2016 DOB: 08-16-1960  Age: 55 y.o. MRN#: [id number] Attending Physician: Charlynne Cousins, MD Primary Care Physician: No PCP Per Patient Admit Date: 02/05/2016  Reason for Consultation/Follow-up: Establishing goals of care  Life limiting illness of metastatic gastric cancer Subjective: Patient in bed with no family present today.   Reports that she had increase in pain and her PCA basal rate was increased this morning by primary hospitalist   Her pain is well controlled on current regimen.       Patient now agreeable transitioning to [facility] at least long enough for her daughter to work to try and establish a resident here in town. No bed available.  I called and spoke with her daughter and also discussed with social work. Secondary choice is hospice home in [facility]. Patient's daughter, Selena Hayes, reports she wants to go and see facility prior to making decision.  See below:  Length of Stay: 9  Current Medications: Scheduled Meds:  . feeding supplement  1 Container Oral TID BM  . HYDROmorphone   Intravenous Q4H  . lactulose  20 g Oral TID  . metoCLOPramide (REGLAN) injection  5 mg Intravenous Q6H  . milk and molasses  1 enema Rectal Once  . nicotine  14 mg Transdermal Daily  . OLANZapine  5 mg Oral QHS  . pantoprazole  40 mg Oral BID  . polyethylene glycol  17 g Oral Daily  . senna  2 tablet Oral QHS    Continuous Infusions:    PRN Meds: acetaminophen **OR** acetaminophen, bisacodyl, diphenhydrAMINE **OR** diphenhydrAMINE, HYDROmorphone, iopamidol, LORazepam, milk and molasses, ondansetron (ZOFRAN) IV, ondansetron **OR** [DISCONTINUED] ondansetron (ZOFRAN) IV, promethazine, sodium chloride  flush  Physical Exam         Weak frail elderly lady resting in bed, sleepy but arousable. No distress S1-S2 Clear breath sounds anteriorly Abdomen firm, mildly distended-patient went for paracenteses on 9-8, globally tender to palpation  Extremities warm to touch no edema Awake alert oriented  Vital Signs: BP 109/72 (BP Location: Right Arm)   Pulse (!) 119   Temp 100 F (37.8 C) (Oral)   Resp 13   Ht 5\' 2"  (1.575 m)   Wt 47.7 kg (105 lb 2.6 oz)  SpO2 (!) 80%   BMI 19.23 kg/m  SpO2: SpO2: (!) 80 % O2 Device: O2 Device: Nasal Cannula O2 Flow Rate: O2 Flow Rate (L/min): 1.5 L/min  Intake/output summary:   Intake/Output Summary (Last 24 hours) at 02/15/16 1308 Last data filed at 02/15/16 0600  Gross per 24 hour  Intake            99.81 ml  Output                0 ml  Net            99.81 ml   LBM: Last BM Date: 02/13/16 Baseline Weight: Weight: 41.7 kg (92 lb) Most recent weight: Weight: 47.7 kg (105 lb 2.6 oz)       Palliative Assessment/Data:    Flowsheet Rows   Flowsheet Row Most Recent Value  Intake Tab  Referral Department  Hospitalist  Unit at Time of Referral  Oncology Unit  Palliative Care Primary Diagnosis  Cancer  Date Notified  02/06/16  Palliative Care Type  Return patient Palliative Care  Reason for referral  Pain, Non-pain Symptom, Clarify Goals of Care  Date of Admission  02/05/16  Date first seen by Palliative Care  02/07/16  # of days IP prior to Palliative referral  1  Clinical Assessment  Palliative Performance Scale Score  30%  Pain Max last 24 hours  6  Pain Min Last 24 hours  4  Dyspnea Max Last 24 Hours  4  Dyspnea Min Last 24 hours  3  Nausea Max Last 24 Hours  4  Nausea Min Last 24 Hours  3  Anxiety Max Last 24 Hours  5  Anxiety Min Last 24 Hours  4  Psychosocial & Spiritual Assessment  Palliative Care Outcomes  Patient/Family meeting held?  Yes  Who was at the meeting?  patient daughter   Palliative Care follow-up planned   Yes, Home      Patient Active Problem List   Diagnosis Date Noted  . Bilious vomiting with nausea   . Metastasis from gastric cancer ([place])   . Goals of care, counseling/discussion   . DNR (do not resuscitate)   . Ascitic fluid   . Encounter for palliative care   . Abdominal pain 02/06/2016  . UTI (lower urinary tract infection) 02/06/2016  . Absolute anemia 02/06/2016  . Gastric adenocarcinoma ([place]) 02/06/2016  . Cancer related pain   . 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  Pain well controlled on current regimen.  Would recommend discharge on current settings with hospice to further titrate PCA at residential hospice on discharge.    Patient with existential suffering but understands poor prognosis.  She has been anxious and this has been complicating her pain.  Ativan as needed as well as continue zyprexa for anxiety.  Nausea improved somewhat. Continue reglan every 6 hours and zyprexa QHS.  Unfortunately, her INR is elevated and she was unable to get Pleurex placed.  She is  not on anticoagulation and elevated INR likely sequelae of her disease.    Discussed immunotherapy with Dr. Burr Medico on 9/14.  Tests pending, however chance of being responsive to immunotherapy is low (single digit percentage)   Continue current bowel regimen, patient is now having good  bowel movements  Goals of care discussions: Ongoing discussions with patient and family regarding hospice on D/C. Patient was hoping for better [facility], but none available. Her daughter has spoken with social worker about hospice home in [place] as potential discharge option .  Code Status:    Code Status Orders        Start     Ordered   02/08/16 1058  Do not attempt resuscitation (DNR)  Continuous    Question Answer Comment  In the event of cardiac or respiratory ARREST Do not call a "code blue"   In the event of cardiac or respiratory ARREST Do not perform Intubation, CPR, defibrillation or ACLS   In the event of cardiac or respiratory ARREST Use medication by any route, position, wound care, and other measures to relive pain and suffering. May use oxygen, suction and manual treatment of airway obstruction as needed for comfort.      02/08/16 1059    Code Status History    Date Active Date Inactive Code Status Order ID Comments User Context   02/06/2016  6:21 AM 02/08/2016 10:59 AM Full Code [id number]  Rise Patience, MD ED       Prognosis: - Patient continues to decline. She appears to be progressing quickly and I feel based upon her course over the last several days, her prognosis is likely less than 2 weeks.  Discharge Planning: Home with Hospice has been the goal. Her daughter has been trying to find a residence to rent where she can take her mother with the support of home hospice as patient has no home.  The prior plan to live with her former daughter in law is no longer an option.  She would be well served by residential hospice and I discussed in conjunction with case manager with this patient regarding pursuing residential hospice placement on 9/16. She is now willing to pursue residential hospice, at least until her daughter can set up a residence. The only options for discharge that will serve her well are home with hospice vs residential hospice placement.     Care plan was discussed with  Patient, daughter Selena Hayes Dr Venetia Constable, Care management  Thank you for allowing the Palliative Medicine Team to assist in the care of this patient.   Time In: 1100 Time Out: 1130 Total Time 30 Prolonged Time Billed  no       Greater than 50%  of this time was spent counseling and coordinating care related to the above assessment and plan.  Micheline Rough, MD  Please contact Palliative Medicine Team phone at [phone] for questions and concerns.

## 2016-02-15 NOTE — Progress Notes (Deleted)
Physician Discharge Summary  Selena Hayes [id number] DOB: 1961-03-03 DOA: 02/05/2016  PCP: No PCP Per Patient  Admit date: 02/05/2016 Discharge date: 02/15/2016  Admitted From: home Disposition:  Residential hospice  Recommendations for Outpatient Follow-up:  1. Going to residential hospice.  Home Health:no Equipment/Devices:none  Discharge Condition:stable CODE STATUS:full Diet recommendation: Regular  Brief/Interim Summary: 55 y.o. 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 Patient received received 3 cycles chemotherapy. She has been having abdominal pain since the surgery, it has getting worse daily, also developed 25 pounds weight loss in the past few months. CT abd showed peritoneal carcinomatosis, ascites   Discharge Diagnoses:  Intractable abdominal pain in the setting of peritoneal carcinomatosis: IR was consulted and performed paracentesis with improvement in her pain, she also started on IV Dilaudid which was controlled her pain. 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 After today's abdominal ultrasound was repeated that showed worsening of her ascites, her INR was checked which was elevated. So I are deferred placement of a drain due to the high risk of bleeding. She does have metastatic disease to the liver she is not anticoagulation. At this point due to the patient's high needs of IV medication (narcotics) it was recommended that she be discharged to residential hospice, Palliative Care was consulted who recommended residential hospice. After a long discussion with the patient and family they decided to move towards comfort care.  Metastatic gastric carcinoma with peritoneal carcinomatosis:  Hypokalemia  hypomagnesemia: Was repeated.  Constipation: Resolved with lactulose.   Discharge Instructions  Discharge Instructions    Diet - low sodium heart healthy    Complete by:  As directed    Increase activity slowly    Complete by:  As directed        Medication List    TAKE these medications   HYDROmorphone 1 mg/mL injection Commonly known as:  DILAUDID Inject 4.5 mLs (4.5 mg total) into the vein every 4 (four) hours.   HYDROmorphone 2 mg/mL Soln Commonly known as:  DILAUDID Inject 1 mg into the vein every 2 (two) hours as needed (breakthrough pain).   LORazepam 0.5 MG tablet Commonly known as:  ATIVAN Take 0.5 mg by mouth at bedtime.   multivitamin with minerals Tabs tablet Take 1 tablet by mouth daily.   OLANZapine 5 MG tablet Commonly known as:  ZYPREXA Take 1 tablet (5 mg total) by mouth at bedtime.   ondansetron 4 MG disintegrating tablet Commonly known as:  ZOFRAN-ODT Take 4 mg by mouth every 8 (eight) hours as needed for nausea or vomiting.   oxyCODONE-acetaminophen 5-325 MG tablet Commonly known as:  PERCOCET/ROXICET Take 1-2 tablets by mouth every 4 (four) hours as needed for severe pain.   pantoprazole 40 MG tablet Commonly known as:  PROTONIX Take 40 mg by mouth daily.   polyethylene glycol packet Commonly known as:  MIRALAX / GLYCOLAX Take 17 g by mouth daily as needed for mild constipation.   promethazine 25 MG tablet Commonly known as:  PHENERGAN Take 25 mg by mouth every 6 (six) hours as needed for nausea or vomiting.      Follow-up Information    [facility] .   Specialty:  [facility] information: [place] [address] [phone]  Allergies  Allergen Reactions  . Keflex [Cephalexin] Hives    Consultations:  PMT   Procedures/Studies: Ct Abdomen Pelvis W Contrast  Result Date: 02/06/2016 CLINICAL DATA:  Gastric cancer dx'd February 2017, 9 chemotherapy sessions, hx of  gastrectomy in July 2017,pt.state increased abdominal discomfort with constipation and unable to eat anything because of vomiting EXAM: CT ABDOMEN AND PELVIS WITH CONTRAST TECHNIQUE: Multidetector CT imaging of the abdomen and pelvis was performed using the standard protocol following bolus administration of intravenous contrast. CONTRAST:  95mL ISOVUE-300 IOPAMIDOL (ISOVUE-300) INJECTION 61% COMPARISON:  Chest radiograph, 02/06/2016. FINDINGS: Lower chest: Mild dependent subsegmental atelectasis. Minimal right pleural fluid. Heart normal in size. Hepatobiliary: The 5 mm low-density lesion at the dome of the liver, nonspecific. No other liver masses or lesions. Some fluid lies along the falciform ligament. Liver normal in size. Gallbladder is unremarkable. No bile duct dilation. Pancreas: Unremarkable Spleen: Normal Adrenals/Urinary Tract: No adrenal masses. 12 mm low-density right kidney upper pole mass and 3.5 cm low-density midpole left renal mass, both cysts. No other renal masses or lesions, no stones and no hydronephrosis. Ureters normal in course and in caliber. Bladder is unremarkable. Stomach/Bowel/Peritoneal cavity: Moderate amount of ascites. Some of the fluid appears loculated adjacent to the liver and at the root of the small bowel mesentery. Cystic and solid-appearing masses in the adnexal regions of the pelvis, largest on the left measuring 5.3 x 3.6 x 3.7 cm. There are reticulonodular type opacities in noted along the lower small bowel mesenteric. Changes from previous stomach surgery with formation of a gastrojejunostomy. First portion of the duodenum is been ligated in the right upper quadrant. No stomach mass. No evidence of bowel obstruction. No bowel wall thickening or inflammatory changes. Colon is mildly distended with increased stool noted in the transverse and right colon. Normal appendix is visualized. Vascular/Lymphatic: Mild atherosclerotic calcifications noted along a normal caliber  abdominal aorta. No discrete enlarged lymph nodes. Reproductive: Uterus is unremarkable. Bilateral cystic and solid adnexal masses as described. Musculoskeletal: Developmental anomaly with a small disc at the L4-L5 level. No osteoblastic or osteolytic lesions. Other: None IMPRESSION: 1. Moderate amount of ascites with reticular nodular type opacities noted along the small bowel mesentery. This is likely due to peritoneal carcinomatosis. There are cystic and solid adnexal masses which could reflect the ovaries, but are more suspicious for dependent peritoneal metastatic implants. 2. 5 mm low-density liver lesion, nonspecific, possibly metastatic disease but more likely small cyst. No other evidence of metastatic disease. 3. Changes from gastric surgery with formation of a gastrojejunostomy. No stomach mass is visualized. 4. Generalized increased stool in the colon. 5. Mild aortic atherosclerosis. Electronically Signed   By: Lajean Manes M.D.   On: 02/06/2016 11:06   US Abdomen Limited  Result Date: 02/12/2016 CLINICAL DATA:  Ascites EXAM: LIMITED ABDOMEN ULTRASOUND FOR ASCITES TECHNIQUE: Limited ultrasound survey for ascites was performed in all four abdominal quadrants. COMPARISON:  02/07/2016 FINDINGS: There is a moderate amount of ascites seen in all 4 quadrants. Loculated ascites versus tethered liver margin for example on image 3, and similar to the recent CT scan. IMPRESSION: 1. Moderate amount of ascites in all 4 quadrants. Tethered liver margin would be abnormal for simple ascites and reflects the appearance of peritoneal carcinomatosis. Electronically Signed   By: Van Clines M.D.   On: 02/12/2016 11:56   US Paracentesis  Result Date: 02/07/2016 INDICATION: Metastatic gastric cancer, ascites. Request made for diagnostic and therapeutic paracentesis. EXAM: ULTRASOUND GUIDED DIAGNOSTIC AND THERAPEUTIC  PARACENTESIS MEDICATIONS: None. COMPLICATIONS: None immediate. PROCEDURE: Informed written  consent was obtained from the patient after a discussion of the risks, benefits and alternatives to treatment. A timeout was performed prior to the initiation of the procedure. Initial ultrasound scanning demonstrates a small to moderate amount of ascites within the right lower abdominal quadrant. The right lower abdomen was prepped and draped in the usual sterile fashion. 1% lidocaine was used for local anesthesia. Following this, a Yueh catheter was introduced. An ultrasound image was saved for documentation purposes. The paracentesis was performed. The catheter was removed and a dressing was applied. The patient tolerated the procedure well without immediate post procedural complication. FINDINGS: A total of approximately 2.8 liters of yellow fluid was removed. Samples were sent to the laboratory as requested by the clinical team. IMPRESSION: Successful ultrasound-guided diagnostic and therapeutic paracentesis yielding 2.8 liters of peritoneal fluid. Read by: Rowe Robert, PA-C Electronically Signed   By: Jacqulynn Cadet M.D.   On: 02/07/2016 16:13   Dg Abd Acute W/chest  Result Date: 02/06/2016 CLINICAL DATA:  55 year old female with history of stage IV stomach cancer and partial gastrectomy presenting with severe mid and lower abdominal pain. EXAM: DG ABDOMEN ACUTE W/ 1V CHEST COMPARISON:  None. FINDINGS: There is minimal left lung base atelectatic changes. A 12 mm rounded radiopaque nodular density in the right lower lung field laterally in the seventh intercostal space likely corresponds to the nipple shadow. Repeat radiograph with nipple markers may provide better evaluation. The lungs are otherwise clear. There is no pleural effusion or pneumothorax. The cardiac silhouette is within normal limits. Right pectoral Port-A-Cath with tip at the cavoatrial junction. No acute osseous pathology. There is postsurgical changes of the bowel with multiple surgical sutures in the left hemi abdomen. Large amount of  dense stool noted throughout the colon. There is no bowel dilatation or evidence of obstruction. No free air or radiopaque calculi noted. The soft tissues and the osseous structures are grossly unremarkable. IMPRESSION: Constipation.  No bowel obstruction or free air. A Next 12 mm right lung base nodular density likely corresponds to the nipple shadow. Right-sided Port-A-Cath with tip at the cavoatrial junction. Electronically Signed   By: Anner Crete M.D.   On: 02/06/2016 01:15     Subjective: She relates her pain seems to be well controlled on current dose.  Discharge Exam: Vitals:   02/14/16 2002 02/15/16 0430  BP:  109/72  Pulse:  (!) 119  Resp: 14 13  Temp:  100 F (37.8 C)   Vitals:   02/14/16 1449 02/14/16 2000 02/14/16 2002 02/15/16 0430  BP: 100/77 113/75  109/72  Pulse: (!) 121 (!) 117  (!) 119  Resp: 17 16 14 13   Temp: 98.6 F (37 C) 98.9 F (37.2 C)  100 F (37.8 C)  TempSrc: Oral Oral  Oral  SpO2: 92% 91%  (!) 80%  Weight:      Height:        General: Pt is alert, awake, not in acute distress Cardiovascular: RRR, S1/S2 +, no rubs, no gallops Respiratory: CTA bilaterally, no wheezing, no rhonchi Abdominal: Soft, NT, ND, bowel sounds + Extremities: no edema, no cyanosis    The results of significant diagnostics from this hospitalization (including imaging, microbiology, ancillary and laboratory) are listed below for reference.     Microbiology: Recent Results (from the past 240 hour(s))  Blood culture (routine x 2)     Status: None   Collection Time: 02/06/16 12:06 AM  Result Value Ref Range Status   Specimen Description BLOOD PORT  Final   Special Requests BOTTLES DRAWN AEROBIC AND ANAEROBIC 5ML EA  Final   Culture   Final    NO GROWTH 5 DAYS Performed at [facility]    Report Status 02/11/2016 FINAL  Final  Blood culture (routine x 2)     Status: None   Collection Time: 02/06/16 12:38 AM  Result Value Ref Range Status   Specimen  Description BLOOD BLOOD RIGHT FOREARM  Final   Special Requests BOTTLES DRAWN AEROBIC AND ANAEROBIC 5ML EA  Final   Culture   Final    NO GROWTH 5 DAYS Performed at [facility]    Report Status 02/11/2016 FINAL  Final  Culture, Urine     Status: None   Collection Time: 02/06/16  2:40 AM  Result Value Ref Range Status   Specimen Description URINE, RANDOM  Final   Special Requests NONE  Final   Culture NO GROWTH Performed at [facility]   Final   Report Status 02/08/2016 FINAL  Final  Culture, body fluid-bottle     Status: None   Collection Time: 02/07/16  3:12 PM  Result Value Ref Range Status   Specimen Description PERITONEAL  Final   Special Requests NONE  Final   Culture NO GROWTH 5 DAYS  Final   Report Status 02/12/2016 FINAL  Final  Gram stain     Status: None   Collection Time: 02/07/16  3:12 PM  Result Value Ref Range Status   Specimen Description PERITONEAL  Final   Special Requests NONE  Final   Gram Stain   Final    WBC PRESENT, PREDOMINANTLY MONONUCLEAR NO ORGANISMS SEEN CYTOSPIN SMEAR    Report Status 02/07/2016 FINAL  Final     Labs: BNP (last 3 results) No results for input(s): BNP in the last 8760 hours. Basic Metabolic Panel:  Recent Labs Lab 02/09/16 0410 02/09/16 0412 02/10/16 0415 02/11/16 0600  NA  --  140 136 134*  K  --  3.1* 4.0 4.1  CL  --  109 101 97*  CO2  --  25 27 28   GLUCOSE  --  282* 123* 117*  BUN  --  6 9 10   CREATININE  --  0.89 0.47 0.60  CALCIUM  --  7.6* 8.5* 8.8*  MG 1.3*  --  1.9 1.7   Liver Function Tests: No results for input(s): AST, ALT, ALKPHOS, BILITOT, PROT, ALBUMIN in the last 168 hours. No results for input(s): LIPASE, AMYLASE in the last 168 hours. No results for input(s): AMMONIA in the last 168 hours. CBC:  Recent Labs Lab 02/09/16 0412  WBC 5.8  HGB 10.2*  HCT 30.7*  MCV 89.8  PLT 277   Cardiac Enzymes: No results for input(s): CKTOTAL, CKMB, CKMBINDEX, TROPONINI in the last 168  hours. BNP: Invalid input(s): POCBNP CBG:  Recent Labs Lab 02/13/16 0733 02/13/16 1723 02/14/16 0019 02/14/16 0732 02/14/16 1652  GLUCAP 106* 134* 127* 100* 108*   D-Dimer No results for input(s): DDIMER in the last 72 hours. Hgb A1c No results for input(s): HGBA1C in the last 72 hours. Lipid Profile No results for input(s): CHOL, HDL, LDLCALC, TRIG, CHOLHDL, LDLDIRECT in the last 72 hours. Thyroid function studies No results for input(s): TSH, T4TOTAL, T3FREE, THYROIDAB in the last 72 hours.  Invalid input(s): FREET3 Anemia work up No results for input(s): VITAMINB12, FOLATE, FERRITIN, TIBC, IRON, RETICCTPCT in the last 36  hours. Urinalysis    Component Value Date/Time   COLORURINE YELLOW 02/06/2016 0204   APPEARANCEUR CLOUDY (A) 02/06/2016 0204   LABSPEC 1.011 02/06/2016 0204   PHURINE 6.0 02/06/2016 0204   GLUCOSEU NEGATIVE 02/06/2016 0204   HGBUR TRACE (A) 02/06/2016 0204   BILIRUBINUR NEGATIVE 02/06/2016 0204   KETONESUR 40 (A) 02/06/2016 0204   PROTEINUR NEGATIVE 02/06/2016 0204   NITRITE NEGATIVE 02/06/2016 0204   LEUKOCYTESUR LARGE (A) 02/06/2016 0204   Sepsis Labs Invalid input(s): PROCALCITONIN,  WBC,  LACTICIDVEN Microbiology Recent Results (from the past 240 hour(s))  Blood culture (routine x 2)     Status: None   Collection Time: 02/06/16 12:06 AM  Result Value Ref Range Status   Specimen Description BLOOD PORT  Final   Special Requests BOTTLES DRAWN AEROBIC AND ANAEROBIC 5ML EA  Final   Culture   Final    NO GROWTH 5 DAYS Performed at [facility]    Report Status 02/11/2016 FINAL  Final  Blood culture (routine x 2)     Status: None   Collection Time: 02/06/16 12:38 AM  Result Value Ref Range Status   Specimen Description BLOOD BLOOD RIGHT FOREARM  Final   Special Requests BOTTLES DRAWN AEROBIC AND ANAEROBIC 5ML EA  Final   Culture   Final    NO GROWTH 5 DAYS Performed at [facility]    Report Status 02/11/2016 FINAL  Final   Culture, Urine     Status: None   Collection Time: 02/06/16  2:40 AM  Result Value Ref Range Status   Specimen Description URINE, RANDOM  Final   Special Requests NONE  Final   Culture NO GROWTH Performed at [facility]   Final   Report Status 02/08/2016 FINAL  Final  Culture, body fluid-bottle     Status: None   Collection Time: 02/07/16  3:12 PM  Result Value Ref Range Status   Specimen Description PERITONEAL  Final   Special Requests NONE  Final   Culture NO GROWTH 5 DAYS  Final   Report Status 02/12/2016 FINAL  Final  Gram stain     Status: None   Collection Time: 02/07/16  3:12 PM  Result Value Ref Range Status   Specimen Description PERITONEAL  Final   Special Requests NONE  Final   Gram Stain   Final    WBC PRESENT, PREDOMINANTLY MONONUCLEAR NO ORGANISMS SEEN CYTOSPIN SMEAR    Report Status 02/07/2016 FINAL  Final     Time coordinating discharge: Over 30 minutes  SIGNED:   Charlynne Cousins, MD  [facility] 02/15/2016, 10:44 AM Pager   If 7PM-7AM, please contact night-coverage [email or web address] Password [email or web address]

## 2016-02-15 NOTE — Progress Notes (Addendum)
[place] for Hospice Referral, there are currently no beds available. Patient daughter reports she is going to look at [facility] and inform LCSWA with choice.  Patient daughter reports, "that place was horrible and I am not leaving this hospital until my mother can get a bed at [facility]. I will do everything I can for her to stay here. She is dying." LCSWA provided patient and daughter both emotional support and encouraged the daughter that this writer would assist her anyway possible.  LCSWA called facility and spoke with AC-coordinator Nira Conn [phone] reports patient will have bed at facility tomorrow. Becky Augusta will come tomorrow and assist patient and family with paperwork.  CSW-Sunday will assist with patient disposition. RN notified. MD notified.

## 2016-02-15 NOTE — Progress Notes (Signed)
Pt was lying in bed and awake when I arrived. Her daughter and son-in -law were bedside and very attentive to pt. Pt said she ok and when her son-in-law spoke of how much stronger she was today she replied, they are not going to tell me when to die. Her son-in-law commented on her strength. Pt presented very positively, but respectful her of condition. Pt was visibly tired and falling asleep. CH offered continued support as needed to family. Please page if additional support is needed. [place], M.Div. [phone]

## 2016-02-16 LAB — GLUCOSE, CAPILLARY
GLUCOSE-CAPILLARY: 107 mg/dL — AB (ref 65–99)
Glucose-Capillary: 104 mg/dL — ABNORMAL HIGH (ref 65–99)

## 2016-02-16 MED ORDER — HYDROMORPHONE 1 MG/ML IV SOLN: 5.0000 mg | INTRAVENOUS | Status: AC

## 2016-02-16 MED ORDER — HYDROMORPHONE HCL 2 MG/ML IJ SOLN
4.0000 mg | Freq: Once | INTRAMUSCULAR | Status: AC
  Administered 2016-02-16: 4 mg via INTRAVENOUS
  Filled 2016-02-16: qty 2

## 2016-02-16 MED ORDER — PROMETHAZINE HCL 25 MG/ML IJ SOLN: 25.0000 mg | Freq: Four times a day (QID) | INTRAMUSCULAR | 0 refills | Status: AC | PRN

## 2016-02-16 MED ORDER — PROMETHAZINE HCL 25 MG/ML IJ SOLN
25.0000 mg | Freq: Four times a day (QID) | INTRAMUSCULAR | Status: DC | PRN
  Administered 2016-02-16: 25 mg via INTRAVENOUS
  Filled 2016-02-16: qty 1

## 2016-02-16 MED ORDER — METOCLOPRAMIDE HCL 5 MG/ML IJ SOLN
10.0000 mg | Freq: Four times a day (QID) | INTRAMUSCULAR | Status: DC
  Administered 2016-02-16: 10 mg via INTRAVENOUS
  Filled 2016-02-16: qty 2

## 2016-02-16 NOTE — Clinical Social Work Note (Signed)
Per MD patient ready for DC to [facility]. RN, patient, patient's family (Tiffany), and facility notified of DC. RN given number for report. DC packet on chart. DC Summary faxed to facility. Ambulance transport requested for patient. CSW signing off.    Liz Beach MSW, [place], [place], JI:[phone]

## 2016-02-16 NOTE — Progress Notes (Signed)
[facility] PROGRESS NOTE    Progress Note  Selena Hayes  [id number] DOB: [birth year] DOA: 02/05/2016 PCP: No PCP Per Patient     Brief Narrative:   Selena Hayes is an 55 y.o. 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 Patient received received 3 cycles chemotherapy. She has been having abdominal pain since the surgery, it has getting worse daily, also developed 25 pounds weight loss in the past few months. CT abd showed peritoneal carcinomatosis, ascites   Assessment/Plan:   Intractable Abdominal pain in the setting of peritoneal carcinomatosis: Awaiting placement at [facility] facility. Give her Phenergan IV increase her Phenergan dose at facility.  Metastatic gastric cancer with peritoneal carcinomatosis: UTI (lower urinary tract infection) Hypokalemia/hypomagnesemia: Constipation: Normocytic anemia: Gastric adenocarcinoma ([place]) Encounter for palliative care  DNR (do not resuscitate)  DVT prophylaxis: lovenox Family Communication:none Disposition Plan/Barrier to D/C: Residential hospice in 1-2 days Code Status:     Code Status Orders        Start     Ordered   02/08/16 1058  Do not attempt resuscitation (DNR)  Continuous    Question Answer Comment  In the event of cardiac or respiratory ARREST Do not call a "code blue"   In the event of cardiac or respiratory ARREST Do not perform Intubation, CPR, defibrillation or ACLS   In the event of cardiac or respiratory ARREST Use medication by any route, position, wound care, and other measures to relive pain and suffering. May use oxygen, suction and manual treatment of airway obstruction as needed for comfort.      02/08/16 1059    Code Status History    Date Active Date Inactive  Code Status Order ID Comments User Context   02/06/2016  6:21 AM 02/08/2016 10:59 AM Full Code [id number]  Rise Patience, MD ED        IV Access:    Peripheral IV   Procedures and diagnostic studies:   No results found.   Medical Consultants:    None.  Anti-Infectives:   none  Subjective:    [name] she is nauseated.  Objective:    Vitals:   02/14/16 2002 02/15/16 0430 02/15/16 2159 02/16/16 0700  BP:  109/72 107/74 105/64  Pulse:  (!) 119 (!) 115 (!) 128  Resp: 14 13 20 20   Temp:  100 F (37.8 C) 99.3 F (37.4 C) 99.8 F (37.7 C)  TempSrc:  Oral Oral Oral  SpO2:  (!) 80% (!) 88% (!) 83%  Weight:      Height:        Intake/Output Summary (Last 24 hours) at 02/16/16 1016 Last data filed at 02/16/16 0751  Gross per 24 hour  Intake              240 ml  Output              250 ml  Net              -10 ml   Filed Weights   02/05/16 2344 02/06/16 0628  Weight: 41.7 kg (92 lb) 47.7 kg (105 lb 2.6 oz)    Exam: General exam: In no acute distress.Cachectic appearing Respiratory system: Good air movement and clear to auscultation. Cardiovascular system: S1 & S2 heard, RRR. Gastrointestinal system: Abdomen is nondistended,  soft and nontender.  Central nervous system: Alert and oriented. No focal neurological deficits. Extremities: No pedal edema. Skin: No rashes, lesions or ulcers Psychiatry: Judgement and insight appear normal. Mood & affect appropriate.    Data Reviewed:    Labs: Basic Metabolic Panel:  Recent Labs Lab 02/10/16 0415 02/11/16 0600  NA 136 134*  K 4.0 4.1  CL 101 97*  CO2 27 28  GLUCOSE 123* 117*  BUN 9 10  CREATININE 0.47 0.60  CALCIUM 8.5* 8.8*  MG 1.9 1.7   GFR Estimated Creatinine Clearance: 59.1 mL/min (by C-G formula based on SCr of 0.6 mg/dL). Liver Function Tests: No results for input(s): AST, ALT, ALKPHOS, BILITOT, PROT, ALBUMIN in the last 168 hours. No results for input(s): LIPASE, AMYLASE in the  last 168 hours. No results for input(s): AMMONIA in the last 168 hours. Coagulation profile  Recent Labs Lab 02/14/16 0500 02/14/16 1445  INR 2.50 2.65    CBC: No results for input(s): WBC, NEUTROABS, HGB, HCT, MCV, PLT in the last 168 hours. Cardiac Enzymes: No results for input(s): CKTOTAL, CKMB, CKMBINDEX, TROPONINI in the last 168 hours. BNP (last 3 results) No results for input(s): PROBNP in the last 8760 hours. CBG:  Recent Labs Lab 02/14/16 0732 02/14/16 1652 02/15/16 1629 02/16/16 0317 02/16/16 0742  GLUCAP 100* 108* 118* 104* 107*   D-Dimer: No results for input(s): DDIMER in the last 72 hours. Hgb A1c: No results for input(s): HGBA1C in the last 72 hours. Lipid Profile: No results for input(s): CHOL, HDL, LDLCALC, TRIG, CHOLHDL, LDLDIRECT in the last 72 hours. Thyroid function studies: No results for input(s): TSH, T4TOTAL, T3FREE, THYROIDAB in the last 72 hours.  Invalid input(s): FREET3 Anemia work up: No results for input(s): VITAMINB12, FOLATE, FERRITIN, TIBC, IRON, RETICCTPCT in the last 72 hours. Sepsis Labs: No results for input(s): PROCALCITON, WBC, LATICACIDVEN in the last 168 hours. Microbiology Recent Results (from the past 240 hour(s))  Culture, body fluid-bottle     Status: None   Collection Time: 02/07/16  3:12 PM  Result Value Ref Range Status   Specimen Description PERITONEAL  Final   Special Requests NONE  Final   Culture NO GROWTH 5 DAYS  Final   Report Status 02/12/2016 FINAL  Final  Gram stain     Status: None   Collection Time: 02/07/16  3:12 PM  Result Value Ref Range Status   Specimen Description PERITONEAL  Final   Special Requests NONE  Final   Gram Stain   Final    WBC PRESENT, PREDOMINANTLY MONONUCLEAR NO ORGANISMS SEEN CYTOSPIN SMEAR    Report Status 02/07/2016 FINAL  Final     Medications:   . feeding supplement  1 Container Oral TID BM  . HYDROmorphone   Intravenous Q4H  .  HYDROmorphone (DILAUDID) injection   4 mg Intravenous Once  . lactulose  20 g Oral TID  . metoCLOPramide (REGLAN) injection  5 mg Intravenous Q6H  . milk and molasses  1 enema Rectal Once  . nicotine  14 mg Transdermal Daily  . OLANZapine  5 mg Oral QHS  . pantoprazole  40 mg Oral BID  . polyethylene glycol  17 g Oral Daily  . senna  2 tablet Oral QHS   Continuous Infusions:   Time spent: 15 min   LOS: 10 days   Charlynne Cousins  [facility] Pager [phone]  *Please refer to [email or web address], password [email or web address] to get updated schedule on who will round on this patient,  as hospitalists switch teams weekly. If 7PM-7AM, please contact night-coverage at [email or web address], password [email or web address] for any overnight needs.  02/16/2016, 10:16 AM

## 2016-02-16 NOTE — Progress Notes (Signed)
[facility] - [id number] RN Note for [name]   Received request from [place] for family interest in [facility] with request to transfer today.  Chart reviewed.   Met with patient and daughter [name]  To confirm interest and explain services. There is a bed available for patient to transfer today and family is agreeable.     CSW aware. Registration paperwork has been completed . Dr. Orpah Melter to assume care per family request.     Please fax discharge summary to [phone].  RN please call report  to [phone].  Please arrange for transport as soon as possible.   Thank you for this referral!   Mickie Kay, [facility] liaison  [phone]

## 2016-02-16 NOTE — Progress Notes (Signed)
Daily Progress Note   Patient Name: Selena Hayes       Date: 02/16/2016 DOB: Jun 21, 1960  Age: 55 y.o. MRN#: [id number] Attending Physician: Charlynne Cousins, MD Primary Care Physician: No PCP Per Patient Admit Date: 02/05/2016  Reason for Consultation/Follow-up: Establishing goals of care  Life limiting illness of metastatic gastric cancer Subjective: Patient in bed with brother present today.   Her pain is well controlled on current regimen.  Her nausea has worsened.      Patient now agreeable transitioning to [facility]. Hopeful for transfer later today.  See below:  Length of Stay: 10  Current Medications: Scheduled Meds:  . feeding supplement  1 Container Oral TID BM  . HYDROmorphone   Intravenous Q4H  .  HYDROmorphone (DILAUDID) injection  4 mg Intravenous Once  . lactulose  20 g Oral TID  . metoCLOPramide (REGLAN) injection  10 mg Intravenous Q6H  . milk and molasses  1 enema Rectal Once  . nicotine  14 mg Transdermal Daily  . OLANZapine  5 mg Oral QHS  . pantoprazole  40 mg Oral BID  . polyethylene glycol  17 g Oral Daily  . senna  2 tablet Oral QHS    Continuous Infusions:    PRN Meds: acetaminophen **OR** acetaminophen, bisacodyl, diphenhydrAMINE **OR** diphenhydrAMINE, HYDROmorphone, iopamidol, LORazepam, milk and molasses, ondansetron (ZOFRAN) IV, ondansetron **OR** [DISCONTINUED] ondansetron (ZOFRAN) IV, promethazine, sodium chloride flush  Physical Exam         Weak frail elderly lady resting in bed, sleepy but arousable. No distress S1-S2 Clear breath sounds anteriorly Abdomen firm, mildly distended-patient went for paracenteses on 9-8, globally tender to palpation  Extremities warm to touch no edema Awake alert oriented  Vital Signs: BP 105/64 (BP  Location: Right Arm)   Pulse (!) 128   Temp 99.8 F (37.7 C) (Oral)   Resp 20   Ht 5\' 2"  (1.575 m)   Wt 47.7 kg (105 lb 2.6 oz)   SpO2 (!) 83%   BMI 19.23 kg/m  SpO2: SpO2: (!) 83 % O2 Device: O2 Device: Nasal Cannula O2 Flow Rate: O2 Flow Rate (L/min): 3.5 L/min  Intake/output summary:   Intake/Output Summary (Last 24 hours) at 02/16/16 1130 Last data filed at 02/16/16 0751  Gross per 24 hour  Intake  240 ml  Output              250 ml  Net              -10 ml   LBM: Last BM Date: 02/15/16 Baseline Weight: Weight: 41.7 kg (92 lb) Most recent weight: Weight: 47.7 kg (105 lb 2.6 oz)       Palliative Assessment/Data:    Flowsheet Rows   Flowsheet Row Most Recent Value  Intake Tab  Referral Department  Hospitalist  Unit at Time of Referral  Oncology Unit  Palliative Care Primary Diagnosis  Cancer  Date Notified  02/06/16  Palliative Care Type  Return patient Palliative Care  Reason for referral  Pain, Non-pain Symptom, Clarify Goals of Care  Date of Admission  02/05/16  Date first seen by Palliative Care  02/07/16  # of days IP prior to Palliative referral  1  Clinical Assessment  Palliative Performance Scale Score  30%  Pain Max last 24 hours  6  Pain Min Last 24 hours  4  Dyspnea Max Last 24 Hours  4  Dyspnea Min Last 24 hours  3  Nausea Max Last 24 Hours  4  Nausea Min Last 24 Hours  3  Anxiety Max Last 24 Hours  5  Anxiety Min Last 24 Hours  4  Psychosocial & Spiritual Assessment  Palliative Care Outcomes  Patient/Family meeting held?  Yes  Who was at the meeting?  patient daughter   Palliative Care follow-up planned  Yes, Home      Patient Active Problem List   Diagnosis Date Noted  . Bilious vomiting with nausea   . Metastasis from gastric cancer ([place])   . Goals of care, counseling/discussion   . DNR (do not resuscitate)   . Ascitic fluid   . Encounter for palliative care   . Abdominal pain 02/06/2016  . UTI (lower urinary tract  infection) 02/06/2016  . Absolute anemia 02/06/2016  . Gastric adenocarcinoma ([place]) 02/06/2016  . Cancer related pain   . 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  Pain well controlled on current regimen.  Would recommend discharge on current settings with hospice to further titrate PCA at residential hospice on discharge. I did note one episode of muscular jerk today. Would continue to monitor closely for signs of toxicity due to the fact she is now receiving very large dose of Dilaudid. May need to consider rotation to another opioid.  We'll defer to hospice as she is transitioning to [name] later today.   Patient with existential suffering but understands poor prognosis.  She has been anxious and this has been complicating her pain.  Ativan as needed as well as continue zyprexa for anxiety.  Nausea worsened per patient report. Increase reglan every 6 hours and continue zyprexa QHS.  Unfortunately, her INR is elevated and she was unable to get Pleurex placed.  She is not on anticoagulation and elevated INR likely sequelae of her disease.  Continue current bowel regimen, patient is now having good bowel movements   Code Status:    Code Status Orders        Start     Ordered   02/08/16 1058  Do not attempt resuscitation (DNR)  Continuous    Question Answer  Comment  In the event of cardiac or respiratory ARREST Do not call a "code blue"   In the event of cardiac or respiratory ARREST Do not perform Intubation, CPR, defibrillation or ACLS   In the event of cardiac or respiratory ARREST Use medication by any route, position, wound care, and other measures to relive pain and suffering. May use oxygen, suction and manual treatment of airway obstruction as needed for comfort.      02/08/16 1059    Code Status History    Date Active Date Inactive Code Status Order ID Comments User Context   02/06/2016  6:21 AM 02/08/2016 10:59 AM Full Code [id number]  Rise Patience, MD ED       Prognosis: - Patient continues to decline. She appears to be progressing quickly and I feel based upon her course over the last several days, her prognosis is likely less than 2 weeks.  Discharge Planning: Hospice facility   Care plan was discussed with  Patient  Thank you for allowing the Palliative Medicine Team to assist in the care of this patient.   Time In: 0900 Time Out: 0920 Total Time 20 Prolonged Time Billed  no       Greater than 50%  of this time was spent counseling and coordinating care related to the above assessment and plan.  Micheline Rough, MD  Please contact Palliative Medicine Team phone at [phone] for questions and concerns.

## 2016-02-16 NOTE — Progress Notes (Signed)
Report called to RN that will receive patient at [facility].  Patient given 4mg  of IV push for transport.  [facility] arrived to pick patient up for transport.

## 2016-02-16 NOTE — Discharge Summary (Addendum)
Physician Discharge Summary  Taylen Joiner [id number] DOB: February 20, 1961 DOA: 02/05/2016  PCP: No PCP Per Patient  Admit date: 02/05/2016 Discharge date: 02/15/2016  Admitted From: home Disposition:  Residential hospice  Recommendations for Outpatient Follow-up:  1. Going to residential hospice.  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 Patient received received 3 cycles chemotherapy. She has been having abdominal pain since the surgery, it has getting worse daily, also developed 25 pounds weight loss in the past few months. CT abd showed peritoneal carcinomatosis, ascites   Discharge Diagnoses:  Intractable abdominal pain in the setting of peritoneal carcinomatosis: IR was consulted and performed paracentesis with improvement in her pain, she also started on IV Dilaudid which was controlled her pain. 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 After today's abdominal ultrasound was repeated that showed worsening of her ascites, her INR was checked which was elevated. So I are deferred placement of a drain due to the high risk of bleeding. She does have metastatic disease to the liver she is not anticoagulation. At this point due to the patient's high needs of IV medication (narcotics) it was recommended that she be discharged to residential hospice, Palliative Care was consulted who recommended residential hospice. After a long discussion with the patient and family they decided to move towards comfort care.  Metastatic gastric carcinoma with peritoneal carcinomatosis:  Hypokalemia  hypomagnesemia: Constipation: Resolved with lactulose.   Discharge Instructions      Discharge Instructions    Diet - low sodium heart healthy    Complete by:  As directed   Increase activity slowly    Complete by:  As directed       Medication List    TAKE these medications   HYDROmorphone 1 mg/mL injection Commonly known as:  DILAUDID Inject 5 mLs (5 mg total) into the vein every 4 (four) hours.  HYDROmorphone 2 mg/mL Soln Commonly known as:  DILAUDID Inject 1 mg into the vein every 2 (two) hours as needed (breakthrough pain).  LORazepam 0.5 MG tablet Commonly known as:  ATIVAN Take 0.5 mg by mouth at bedtime.  multivitamin with minerals Tabs tablet Take 1 tablet by mouth daily.  OLANZapine 5 MG tablet Commonly known as:  ZYPREXA Take 1 tablet (5 mg total) by mouth at bedtime.  ondansetron 4 MG disintegrating tablet Commonly known as:  ZOFRAN-ODT Take 4 mg by mouth every 8 (eight) hours as needed for nausea or vomiting.  oxyCODONE-acetaminophen 5-325 MG tablet Commonly known as:  PERCOCET/ROXICET Take 1-2 tablets by mouth every 4 (four) hours as needed for severe pain.  pantoprazole 40 MG tablet Commonly known as:  PROTONIX Take 40 mg by mouth daily.  polyethylene glycol packet Commonly known as:  MIRALAX / GLYCOLAX Take 17 g by mouth daily as needed for mild constipation.  promethazine 25 MG tablet Commonly known as:  PHENERGAN Take 25 mg by mouth every 6 (six) hours as needed for nausea or vomiting.        Follow-up Information    [facility] .   Specialty:  [facility] information: [address] [place] [address] [phone]              Allergies  Allergen  Reactions  . Keflex [Cephalexin] Hives    Consultations:  PMT   Procedures/Studies: Imaging Results  Ct Abdomen Pelvis W Contrast  Result Date: 02/06/2016 CLINICAL DATA:  Gastric cancer dx'd February 2017, 9 chemotherapy sessions,  hx of gastrectomy in July 2017,pt.state increased abdominal discomfort with constipation and unable to eat anything because of vomiting EXAM: CT ABDOMEN AND PELVIS WITH CONTRAST TECHNIQUE: Multidetector CT imaging of the abdomen and pelvis was performed using the standard protocol following bolus administration of intravenous contrast. CONTRAST:  83mL ISOVUE-300 IOPAMIDOL (ISOVUE-300) INJECTION 61% COMPARISON:  Chest radiograph, 02/06/2016. FINDINGS: Lower chest: Mild dependent subsegmental atelectasis. Minimal right pleural fluid. Heart normal in size. Hepatobiliary: The 5 mm low-density lesion at the dome of the liver, nonspecific. No other liver masses or lesions. Some fluid lies along the falciform ligament. Liver normal in size. Gallbladder is unremarkable. No bile duct dilation. Pancreas: Unremarkable Spleen: Normal Adrenals/Urinary Tract: No adrenal masses. 12 mm low-density right kidney upper pole mass and 3.5 cm low-density midpole left renal mass, both cysts. No other renal masses or lesions, no stones and no hydronephrosis. Ureters normal in course and in caliber. Bladder is unremarkable. Stomach/Bowel/Peritoneal cavity: Moderate amount of ascites. Some of the fluid appears loculated adjacent to the liver and at the root of the small bowel mesentery. Cystic and solid-appearing masses in the adnexal regions of the pelvis, largest on the left measuring 5.3 x 3.6 x 3.7 cm. There are reticulonodular type opacities in noted along the lower small bowel mesenteric. Changes from previous stomach surgery with formation of a gastrojejunostomy. First portion of the duodenum is been ligated in the right upper quadrant. No stomach mass. No evidence of bowel obstruction. No bowel wall thickening or inflammatory changes. Colon is mildly distended with increased stool noted in the transverse and right colon. Normal appendix is visualized. Vascular/Lymphatic: Mild atherosclerotic calcifications noted along a normal  caliber abdominal aorta. No discrete enlarged lymph nodes. Reproductive: Uterus is unremarkable. Bilateral cystic and solid adnexal masses as described. Musculoskeletal: Developmental anomaly with a small disc at the L4-L5 level. No osteoblastic or osteolytic lesions. Other: None IMPRESSION: 1. Moderate amount of ascites with reticular nodular type opacities noted along the small bowel mesentery. This is likely due to peritoneal carcinomatosis. There are cystic and solid adnexal masses which could reflect the ovaries, but are more suspicious for dependent peritoneal metastatic implants. 2. 5 mm low-density liver lesion, nonspecific, possibly metastatic disease but more likely small cyst. No other evidence of metastatic disease. 3. Changes from gastric surgery with formation of a gastrojejunostomy. No stomach mass is visualized. 4. Generalized increased stool in the colon. 5. Mild aortic atherosclerosis. Electronically Signed   By: Lajean Manes M.D.   On: 02/06/2016 11:06   US Abdomen Limited  Result Date: 02/12/2016 CLINICAL DATA:  Ascites EXAM: LIMITED ABDOMEN ULTRASOUND FOR ASCITES TECHNIQUE: Limited ultrasound survey for ascites was performed in all four abdominal quadrants. COMPARISON:  02/07/2016 FINDINGS: There is a moderate amount of ascites seen in all 4 quadrants. Loculated ascites versus tethered liver margin for example on image 3, and similar to the recent CT scan. IMPRESSION: 1. Moderate amount of ascites in all 4 quadrants. Tethered liver margin would be abnormal for simple ascites and reflects the appearance of peritoneal carcinomatosis. Electronically Signed   By: Van Clines M.D.   On: 02/12/2016 11:56   US Paracentesis  Result Date: 02/07/2016 INDICATION: Metastatic gastric cancer, ascites. Request made for diagnostic and therapeutic paracentesis. EXAM: ULTRASOUND GUIDED DIAGNOSTIC AND THERAPEUTIC  PARACENTESIS MEDICATIONS: None. COMPLICATIONS: None immediate. PROCEDURE: Informed  written consent was obtained from the patient after a discussion of the risks, benefits and alternatives to treatment. A timeout was performed prior to the initiation of the procedure. Initial ultrasound scanning demonstrates a small to moderate amount of ascites within the right lower abdominal quadrant. The right lower abdomen was prepped and draped in the usual sterile fashion. 1% lidocaine was used for local anesthesia. Following this, a Yueh catheter was introduced. An ultrasound image was saved for documentation purposes. The paracentesis was performed. The catheter was removed and a dressing was applied. The patient tolerated the procedure well without immediate post procedural complication. FINDINGS: A total of approximately 2.8 liters of yellow fluid was removed. Samples were sent to the laboratory as requested by the clinical team. IMPRESSION: Successful ultrasound-guided diagnostic and therapeutic paracentesis yielding 2.8 liters of peritoneal fluid. Read by: Rowe Robert, PA-C Electronically Signed   By: Jacqulynn Cadet M.D.   On: 02/07/2016 16:13   Dg Abd Acute W/chest  Result Date: 02/06/2016 CLINICAL DATA:  55 year old female with history of stage IV stomach cancer and partial gastrectomy presenting with severe mid and lower abdominal pain. EXAM: DG ABDOMEN ACUTE W/ 1V CHEST COMPARISON:  None. FINDINGS: There is minimal left lung base atelectatic changes. A 12 mm rounded radiopaque nodular density in the right lower lung field laterally in the seventh intercostal space likely corresponds to the nipple shadow. Repeat radiograph with nipple markers may provide better evaluation. The lungs are otherwise clear. There is no pleural effusion or pneumothorax. The cardiac silhouette is within normal limits. Right pectoral Port-A-Cath with tip at the cavoatrial junction. No acute osseous pathology. There is postsurgical changes of the bowel with multiple surgical sutures in the left hemi abdomen. Large  amount of dense stool noted throughout the colon. There is no bowel dilatation or evidence of obstruction. No free air or radiopaque calculi noted. The soft tissues and the osseous structures are grossly unremarkable. IMPRESSION: Constipation.  No bowel obstruction or free air. A Next 12 mm right lung base nodular density likely corresponds to the nipple shadow. Right-sided Port-A-Cath with tip at the cavoatrial junction. Electronically Signed   By: Anner Crete M.D.   On: 02/06/2016 01:15       Subjective: She relates her pain seems to be well controlled on current dose.  Discharge Exam:     Vitals:   02/14/16 2002 02/15/16 0430  BP:  109/72  Pulse:  (!) 119  Resp: 14 13  Temp:  100 F (37.8 C)         Vitals:   02/14/16 1449 02/14/16 2000 02/14/16 2002 02/15/16 0430  BP: 100/77 113/75  109/72  Pulse: (!) 121 (!) 117  (!) 119  Resp: 17 16 14 13   Temp: 98.6 F (37 C) 98.9 F (37.2 C)  100 F (37.8 C)  TempSrc: Oral Oral  Oral  SpO2: 92% 91%  (!) 80%  Weight:      Height:        General: Pt is alert, awake, not in acute distress Cardiovascular: RRR, S1/S2 +, no rubs, no gallops Respiratory: CTA bilaterally, no wheezing, no rhonchi Abdominal: Soft, NT, ND, bowel sounds + Extremities: no edema, no cyanosis     The results of significant diagnostics from this hospitalization (including imaging, microbiology, ancillary and laboratory) are listed below for reference.     Microbiology:        Recent Results (from the past 240 hour(s))  Blood culture (routine x 2)     Status: None   Collection Time: 02/06/16 12:06 AM  Result Value Ref Range Status   Specimen Description BLOOD PORT  Final   Special Requests BOTTLES DRAWN AEROBIC AND ANAEROBIC 5ML EA  Final   Culture   Final    NO GROWTH 5 DAYS Performed at [facility]   Report Status 02/11/2016 FINAL  Final  Blood culture (routine x 2)     Status: None    Collection Time: 02/06/16 12:38 AM  Result Value Ref Range Status   Specimen Description BLOOD BLOOD RIGHT FOREARM  Final   Special Requests BOTTLES DRAWN AEROBIC AND ANAEROBIC 5ML EA  Final   Culture   Final    NO GROWTH 5 DAYS Performed at [facility]   Report Status 02/11/2016 FINAL  Final  Culture, Urine     Status: None   Collection Time: 02/06/16  2:40 AM  Result Value Ref Range Status   Specimen Description URINE, RANDOM  Final   Special Requests NONE  Final   Culture NO GROWTH Performed at [facility]  Final   Report Status 02/08/2016 FINAL  Final  Culture, body fluid-bottle     Status: None   Collection Time: 02/07/16  3:12 PM  Result Value Ref Range Status   Specimen Description PERITONEAL  Final   Special Requests NONE  Final   Culture NO GROWTH 5 DAYS  Final   Report Status 02/12/2016 FINAL  Final  Gram stain     Status: None   Collection Time: 02/07/16  3:12 PM  Result Value Ref Range Status   Specimen Description PERITONEAL  Final   Special Requests NONE  Final   Gram Stain   Final    WBC PRESENT, PREDOMINANTLY MONONUCLEAR NO ORGANISMS SEEN CYTOSPIN SMEAR   Report Status 02/07/2016 FINAL  Final     Labs: BNP (last 3 results) Recent Labs (within last 365 days)  No results for input(s): BNP in the last 8760 hours.   Basic Metabolic Panel:  Last Labs    Recent Labs Lab 02/09/16 0410 02/09/16 0412 02/10/16 0415 02/11/16 0600  NA  --  140 136 134*  K  --  3.1* 4.0 4.1  CL  --  109 101 97*  CO2  --  25 27 28   GLUCOSE  --  282* 123* 117*  BUN  --  6 9 10   CREATININE  --  0.89 0.47 0.60  CALCIUM  --  7.6* 8.5* 8.8*  MG 1.3*  --  1.9 1.7     Liver Function Tests: Last Labs   No results for input(s): AST, ALT, ALKPHOS, BILITOT, PROT, ALBUMIN in the last 168 hours.   Last Labs   No results for input(s): LIPASE, AMYLASE in the last 168 hours.   Last Labs   No results for input(s):  AMMONIA in the last 168 hours.   CBC:  Last Labs    Recent Labs Lab 02/09/16 0412  WBC 5.8  HGB 10.2*  HCT 30.7*  MCV 89.8  PLT 277     Cardiac Enzymes: Last Labs   No results for input(s): CKTOTAL, CKMB, CKMBINDEX, TROPONINI in the last 168 hours.   BNP: Last Labs   Invalid input(s): POCBNP   CBG:  Last Labs    Recent Labs Lab 02/13/16 0733 02/13/16 1723 02/14/16 0019 02/14/16 0732 02/14/16 1652  GLUCAP 106* 134* 127* 100* 108*     D-Dimer Recent  Labs (last 2 labs)   No results for input(s): DDIMER in the last 72 hours.   Hgb A1c Recent Labs (last 2 labs)   No results for input(s): HGBA1C in the last 72 hours.   Lipid Profile Recent Labs (last 2 labs)   No results for input(s): CHOL, HDL, LDLCALC, TRIG, CHOLHDL, LDLDIRECT in the last 72 hours.   Thyroid function studies  Recent Labs (last 2 labs)   No results for input(s): TSH, T4TOTAL, T3FREE, THYROIDAB in the last 72 hours.  Invalid input(s): FREET3   Anemia work up [employer] (last 2 labs)   No results for input(s): VITAMINB12, FOLATE, FERRITIN, TIBC, IRON, RETICCTPCT in the last 72 hours.   Urinalysis Labs (Brief)          Component Value Date/Time   COLORURINE YELLOW 02/06/2016 0204   APPEARANCEUR CLOUDY (A) 02/06/2016 0204   LABSPEC 1.011 02/06/2016 0204   PHURINE 6.0 02/06/2016 0204   GLUCOSEU NEGATIVE 02/06/2016 0204   HGBUR TRACE (A) 02/06/2016 0204   BILIRUBINUR NEGATIVE 02/06/2016 0204   KETONESUR 40 (A) 02/06/2016 0204   PROTEINUR NEGATIVE 02/06/2016 0204   NITRITE NEGATIVE 02/06/2016 0204   LEUKOCYTESUR LARGE (A) 02/06/2016 0204     Sepsis Labs Last Labs   Invalid input(s): PROCALCITONIN,  WBC,  LACTICIDVEN   Microbiology Recent Results (from the past 240 hour(s))  Blood culture (routine x 2)     Status: None   Collection Time: 02/06/16 12:06 AM  Result Value Ref Range Status   Specimen Description BLOOD PORT  Final   Special Requests BOTTLES  DRAWN AEROBIC AND ANAEROBIC 5ML EA  Final   Culture   Final    NO GROWTH 5 DAYS Performed at [facility]   Report Status 02/11/2016 FINAL  Final  Blood culture (routine x 2)     Status: None   Collection Time: 02/06/16 12:38 AM  Result Value Ref Range Status   Specimen Description BLOOD BLOOD RIGHT FOREARM  Final   Special Requests BOTTLES DRAWN AEROBIC AND ANAEROBIC 5ML EA  Final   Culture   Final    NO GROWTH 5 DAYS Performed at [facility]   Report Status 02/11/2016 FINAL  Final  Culture, Urine     Status: None   Collection Time: 02/06/16  2:40 AM  Result Value Ref Range Status   Specimen Description URINE, RANDOM  Final   Special Requests NONE  Final   Culture NO GROWTH Performed at [facility]  Final   Report Status 02/08/2016 FINAL  Final  Culture, body fluid-bottle     Status: None   Collection Time: 02/07/16  3:12 PM  Result Value Ref Range Status   Specimen Description PERITONEAL  Final   Special Requests NONE  Final   Culture NO GROWTH 5 DAYS  Final   Report Status 02/12/2016 FINAL  Final  Gram stain     Status: None   Collection Time: 02/07/16  3:12 PM  Result Value Ref Range Status   Specimen Description PERITONEAL  Final   Special Requests NONE  Final   Gram Stain   Final    WBC PRESENT, PREDOMINANTLY MONONUCLEAR NO ORGANISMS SEEN CYTOSPIN SMEAR   Report Status 02/07/2016 FINAL  Final     Time coordinating discharge: Over 30 minutes  SIGNED:   Charlynne Cousins, MD           [facility] 02/15/2016, 10:44 AM Pager   If 7PM-7AM, please contact night-coverage [email or web address]  Password [email or web address]

## 2016-03-01 DEATH — deceased

## 2017-01-27 IMAGING — CT CT ABD-PELV W/ CM
2 of 5 series · 16 of 46 positions shown, 18 images · IV contrast (ISOVUE)
Comparison: Chest radiograph, 02/06/2016.

CLINICAL DATA: Gastric cancer dx'd July 2015, 9 chemotherapy
sessions, hx of gastrectomy in November 2015,pt.state increased
abdominal discomfort with constipation and unable to eat anything
because of vomiting

EXAM:
CT ABDOMEN AND PELVIS WITH CONTRAST
TECHNIQUE: Multidetector CT imaging of the abdomen and pelvis was performed
using the standard protocol following bolus administration of
intravenous contrast.
CONTRAST:  80mL EL99V8-ZSS IOPAMIDOL (EL99V8-ZSS) INJECTION 61%

[Series 2: rtn a/p with · axial · 0.76mm/px · z∈[-444,-50]mm · 13 of 93 slices shown, 15 images]
[im 7/93  soft-tissue]
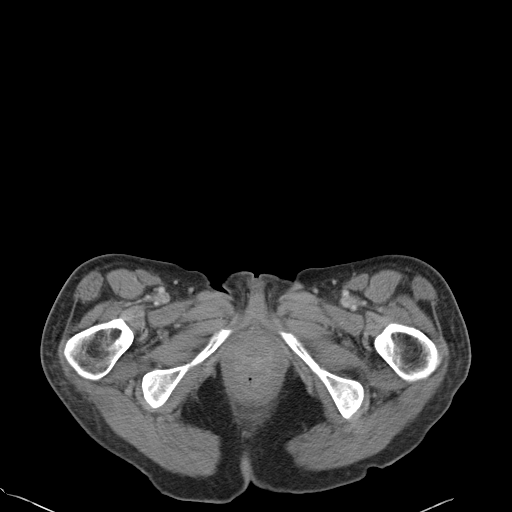
[im 7/93  bone]
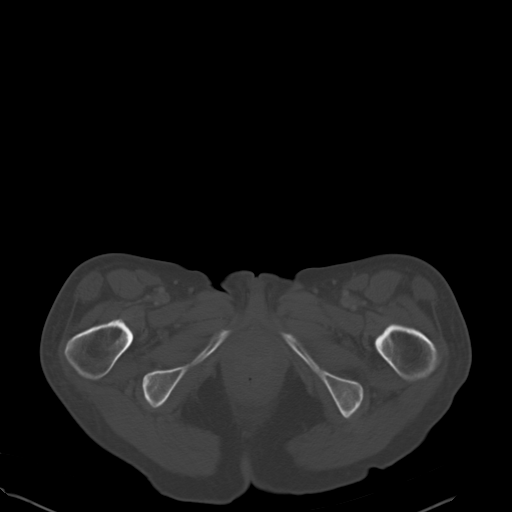
[im 13/93  soft-tissue]
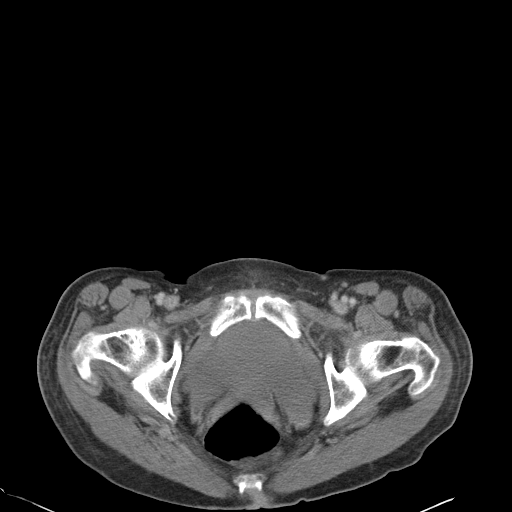
[im 19/93  soft-tissue]
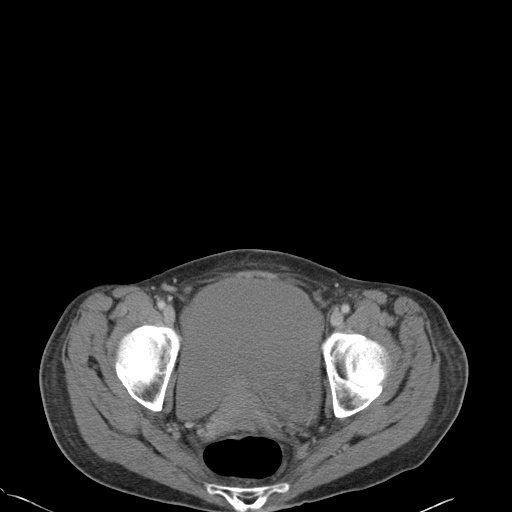
[im 25/93  soft-tissue]
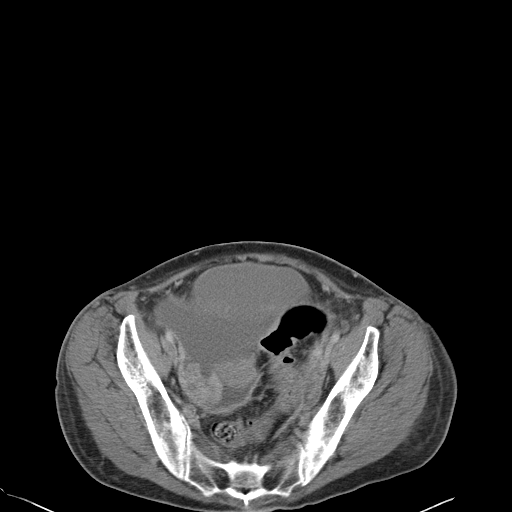
[im 31/93  soft-tissue]
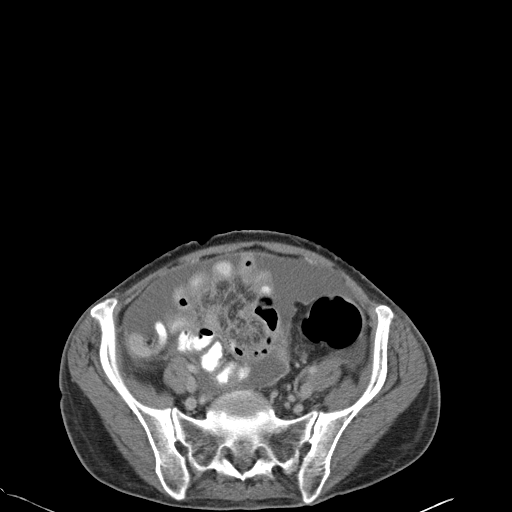
[im 37/93  soft-tissue]
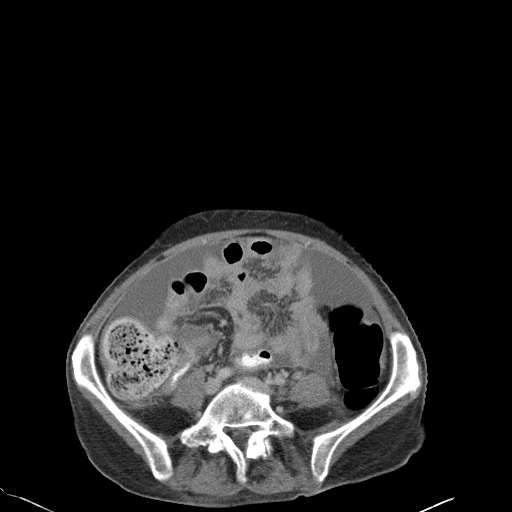
[im 50/93  soft-tissue]
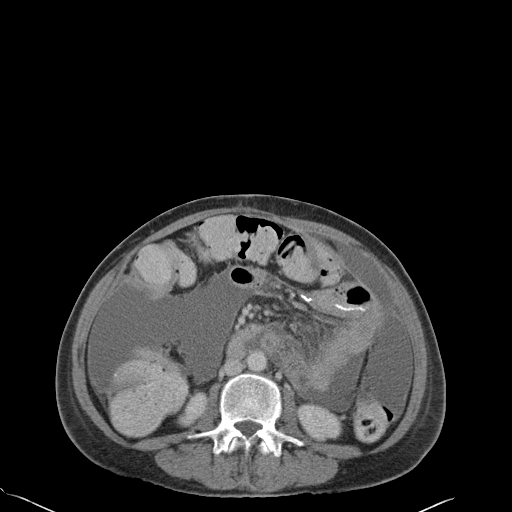
[im 56/93  soft-tissue]
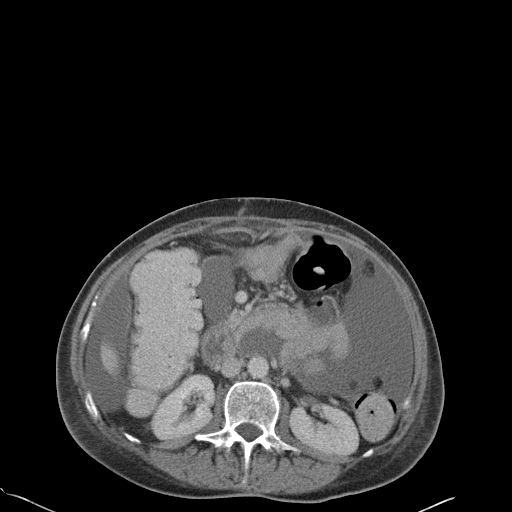
[im 62/93  soft-tissue]
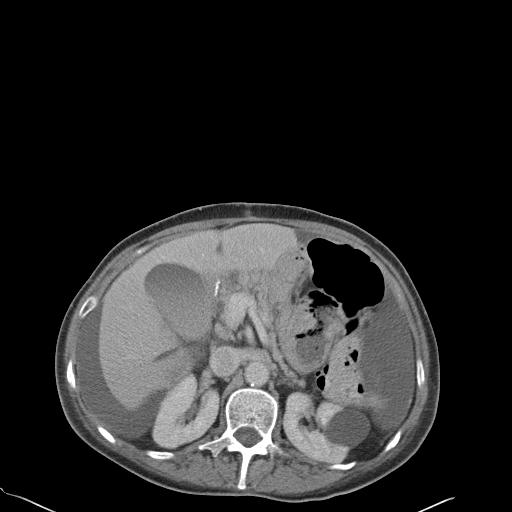
[im 62/93  bone]
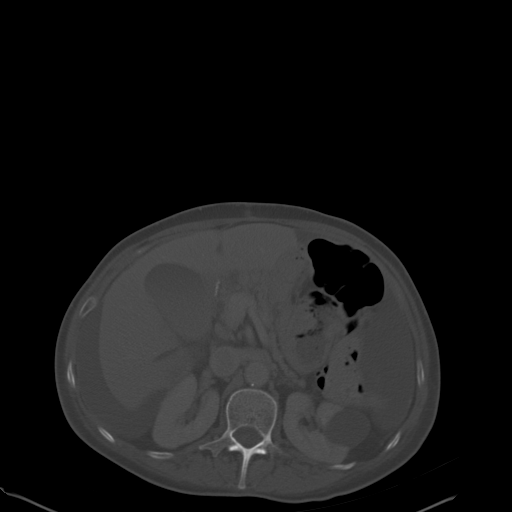
[im 68/93  soft-tissue]
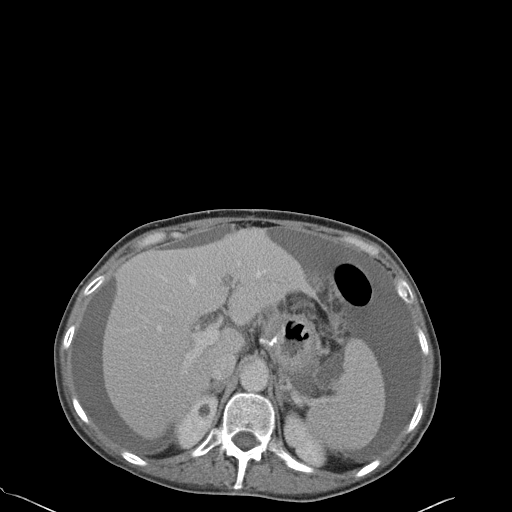
[im 74/93  soft-tissue]
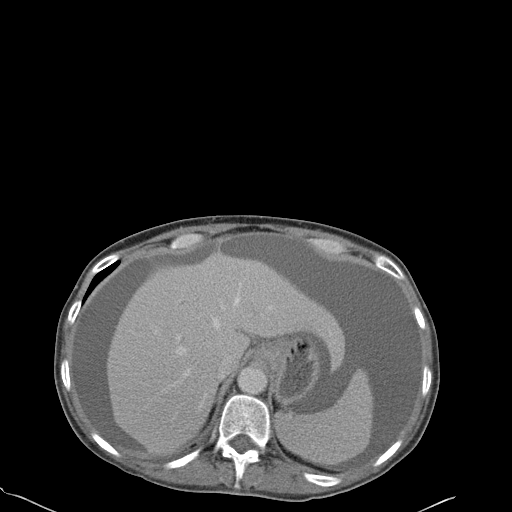
[im 80/93  soft-tissue]
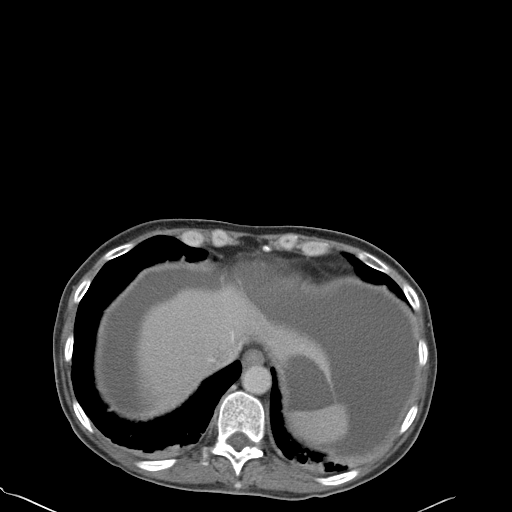
[im 86/93  soft-tissue]
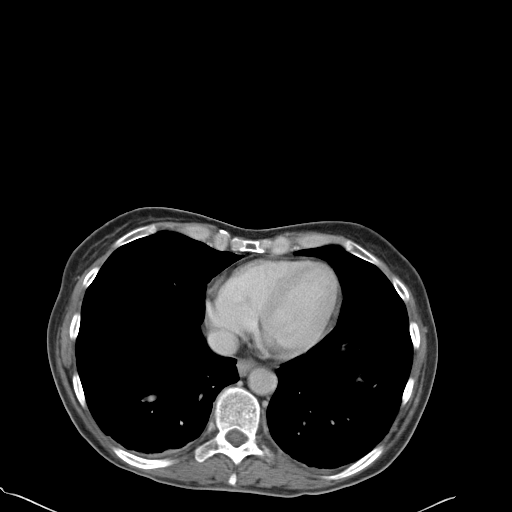

[Series 603: <mpr thick range> · coronal · 0.91mm/px · 3 of 120 slices shown]
[im 40/120  soft-tissue]
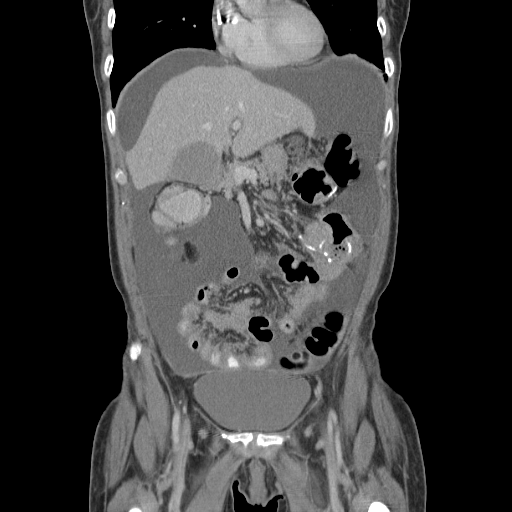
[im 53/120  soft-tissue]
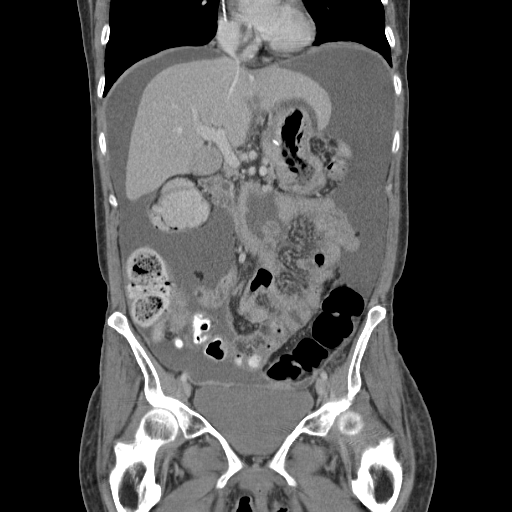
[im 67/120  soft-tissue]
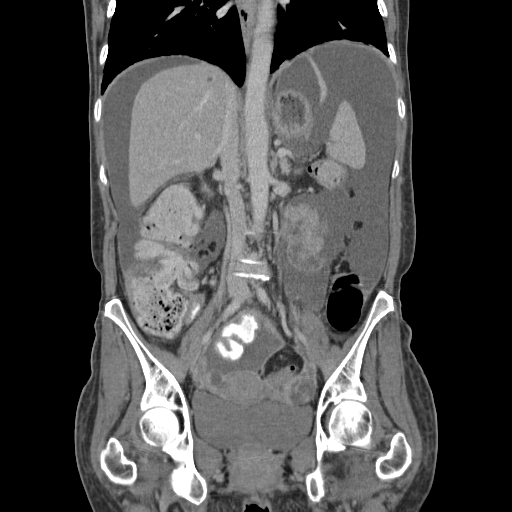

[16 of 46 positions shown; findings below may reference images not displayed]

FINDINGS: Lower chest: Mild dependent subsegmental atelectasis. Minimal right
pleural fluid. Heart normal in size.

Hepatobiliary: The 5 mm low-density lesion at the dome of the liver,
nonspecific. No other liver masses or lesions. Some fluid lies along
the falciform ligament. Liver normal in size. Gallbladder is
unremarkable. No bile duct dilation.

Pancreas: Unremarkable

Spleen: Normal

Adrenals/Urinary Tract: No adrenal masses. 12 mm low-density right
kidney upper pole mass and 3.5 cm low-density midpole left renal
mass, both cysts. No other renal masses or lesions, no stones and no
hydronephrosis. Ureters normal in course and in caliber. Bladder is
unremarkable.

Stomach/Bowel/Peritoneal cavity: Moderate amount of ascites. Some of
the fluid appears loculated adjacent to the liver and at the root of
the small bowel mesentery. Cystic and solid-appearing masses in the
adnexal regions of the pelvis, largest on the left measuring 5.3 x
3.6 x 3.7 cm. There are reticulonodular type opacities in noted
along the lower small bowel mesenteric. Changes from previous
stomach surgery with formation of a gastrojejunostomy. First portion
of the duodenum is been ligated in the right upper quadrant. No
stomach mass. No evidence of bowel obstruction. No bowel wall
thickening or inflammatory changes. Colon is mildly distended with
increased stool noted in the transverse and right colon. Normal
appendix is visualized.

Vascular/Lymphatic: Mild atherosclerotic calcifications noted along
a normal caliber abdominal aorta. No discrete enlarged lymph nodes.

Reproductive: Uterus is unremarkable. Bilateral cystic and solid
adnexal masses as described.

Musculoskeletal: Developmental anomaly with a small disc at the
L4-L5 level. No osteoblastic or osteolytic lesions.

Other: None
IMPRESSION: 1. Moderate amount of ascites with reticular nodular type opacities
noted along the small bowel mesentery. This is likely due to
peritoneal carcinomatosis. There are cystic and solid adnexal masses
which could reflect the ovaries, but are more suspicious for
dependent peritoneal metastatic implants.
2. 5 mm low-density liver lesion, nonspecific, possibly metastatic
disease but more likely small cyst. No other evidence of metastatic
disease.
3. Changes from gastric surgery with formation of a
gastrojejunostomy. No stomach mass is visualized.
4. Generalized increased stool in the colon.
5. Mild aortic atherosclerosis.

## 2017-02-02 IMAGING — US US ABDOMEN LIMITED
1 series · 10 of 10 positions shown · non-contrast
Comparison: 02/07/2016

CLINICAL DATA: Ascites

EXAM:
LIMITED ABDOMEN ULTRASOUND FOR ASCITES
TECHNIQUE: Limited ultrasound survey for ascites was performed in all four
abdominal quadrants.

[Series 1: us abdomen limited · 0.26mm/px · 10 of 10 slices shown]
[im 1/10]
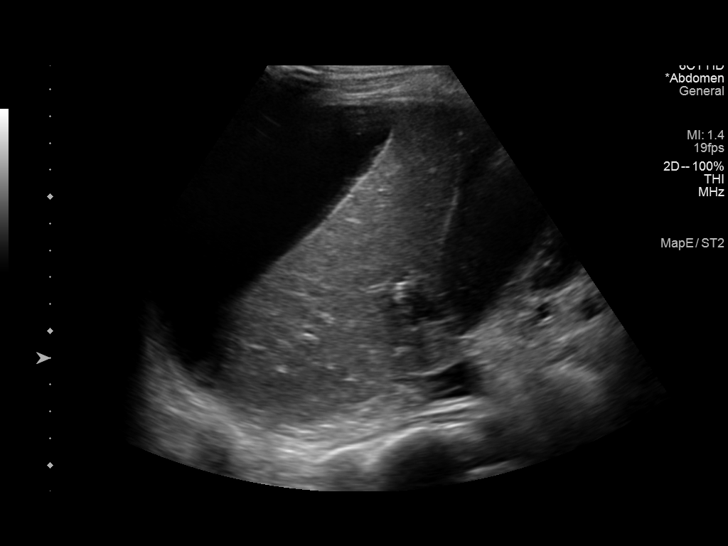
[im 2/10]
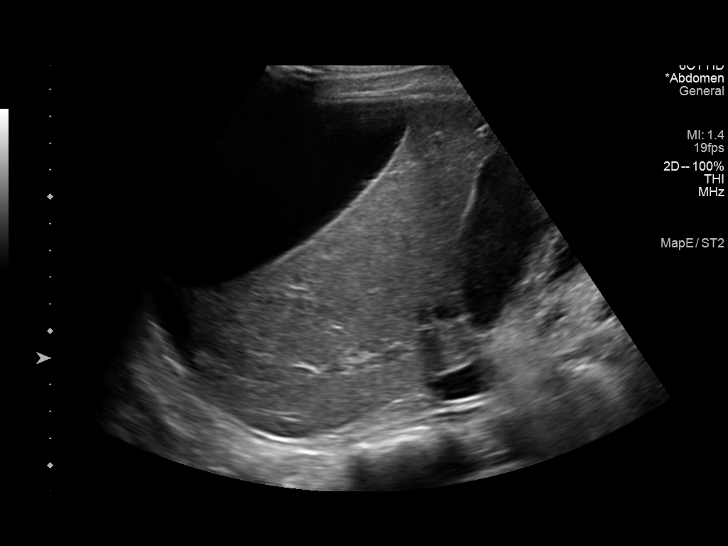
[im 3/10]
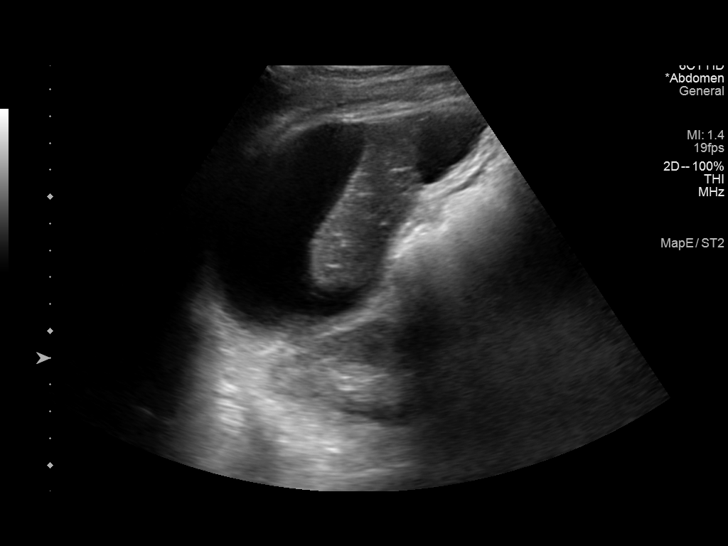
[im 4/10]
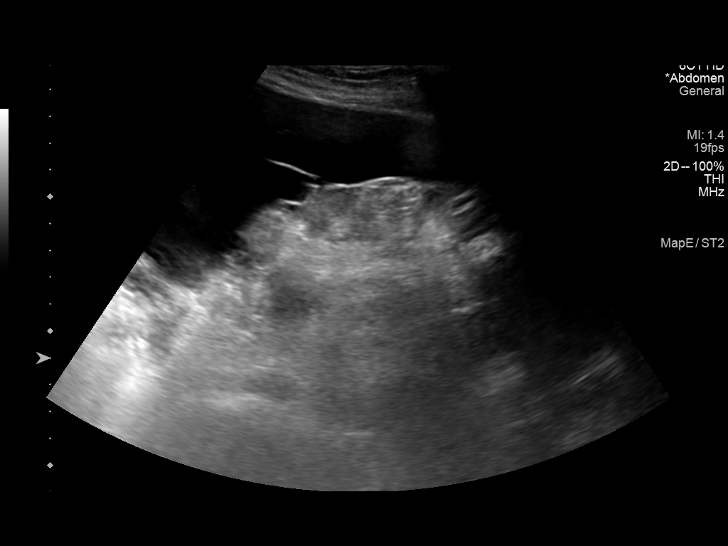
[im 5/10]
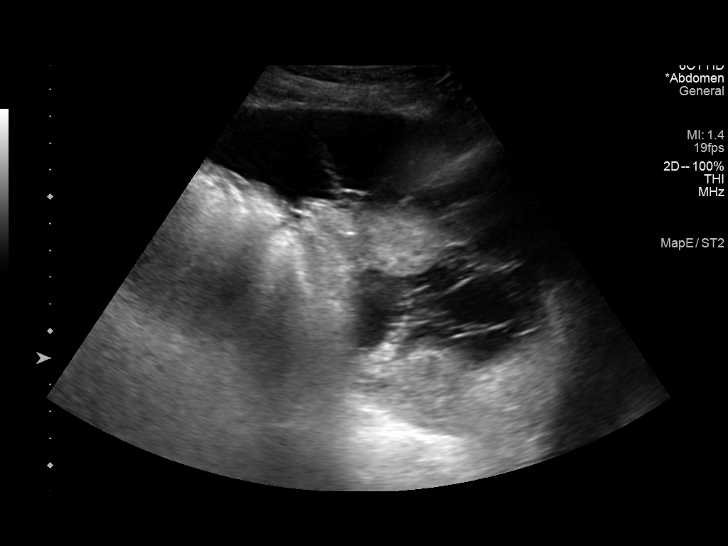
[im 6/10]
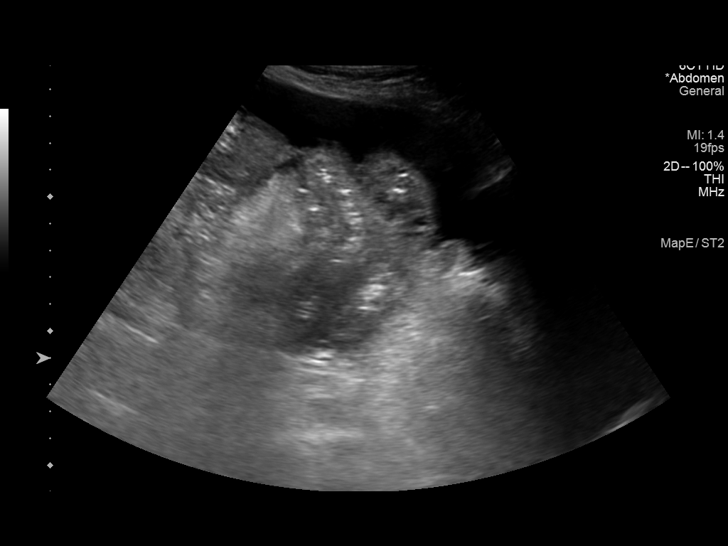
[im 7/10]
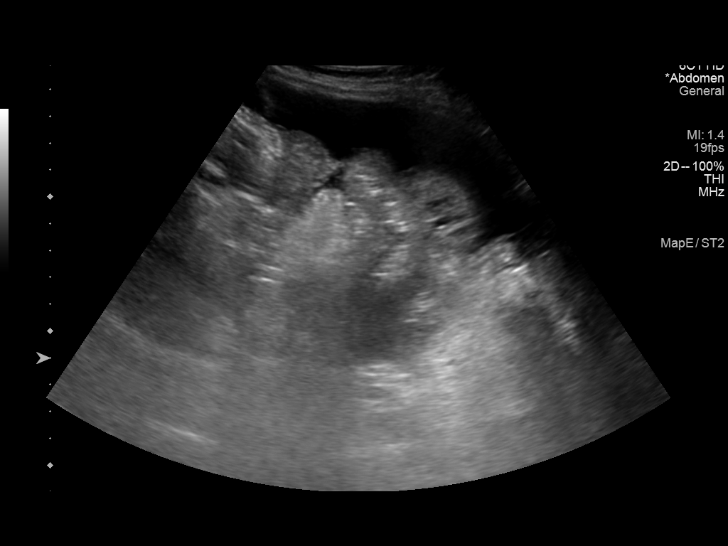
[im 8/10]
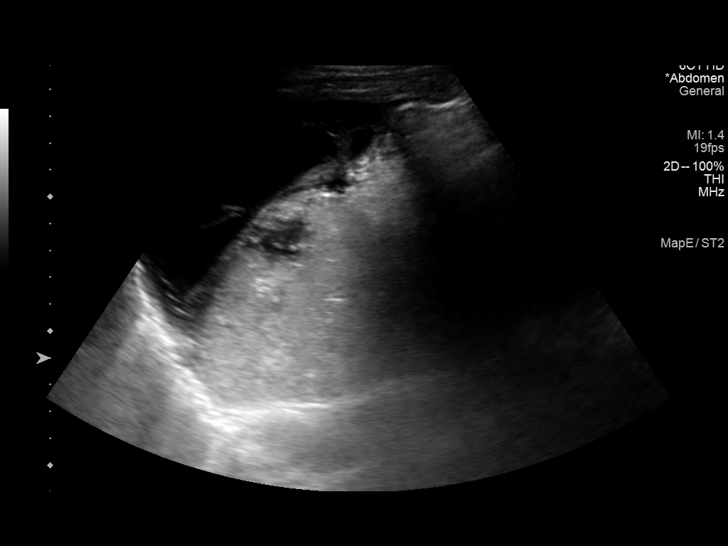
[im 9/10]
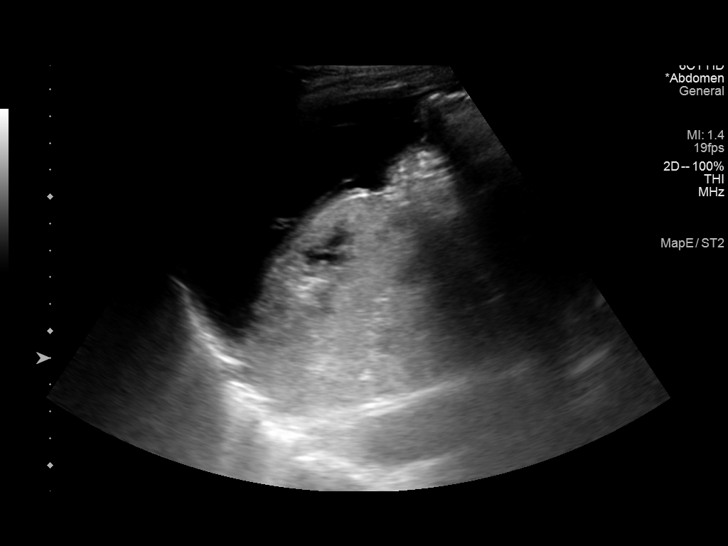
[im 10/10]
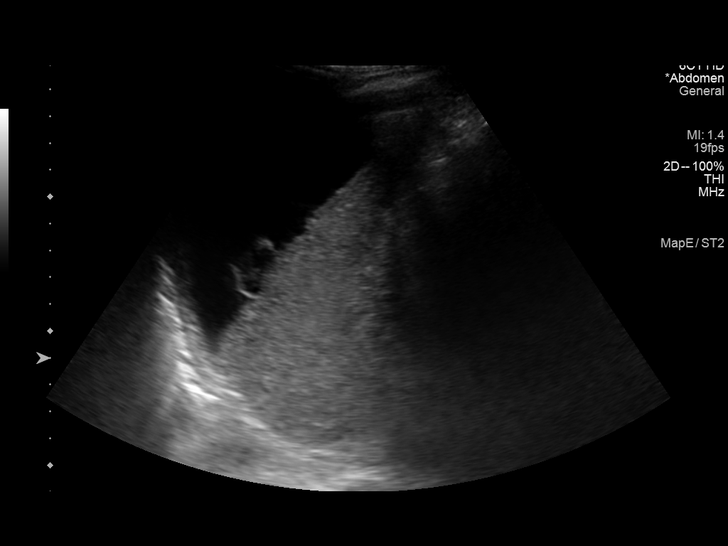

[10 of 10 positions shown; findings below may reference images not displayed]

FINDINGS: There is a moderate amount of ascites seen in all 4 quadrants.
Loculated ascites versus tethered liver margin for example on image
3, and similar to the recent CT scan.
IMPRESSION: 1. Moderate amount of ascites in all 4 quadrants. Tethered liver
margin would be abnormal for simple ascites and reflects the
appearance of peritoneal carcinomatosis.

## 2017-10-15 IMAGING — US US PARACENTESIS
1 series · 6 of 6 positions shown · non-contrast
Comparison: none

INDICATION: Metastatic gastric cancer, ascites. Request made for diagnostic and
therapeutic paracentesis.

[Series 1: us paracentesis · 0.23mm/px · 6 of 6 slices shown]
[im 1/6]
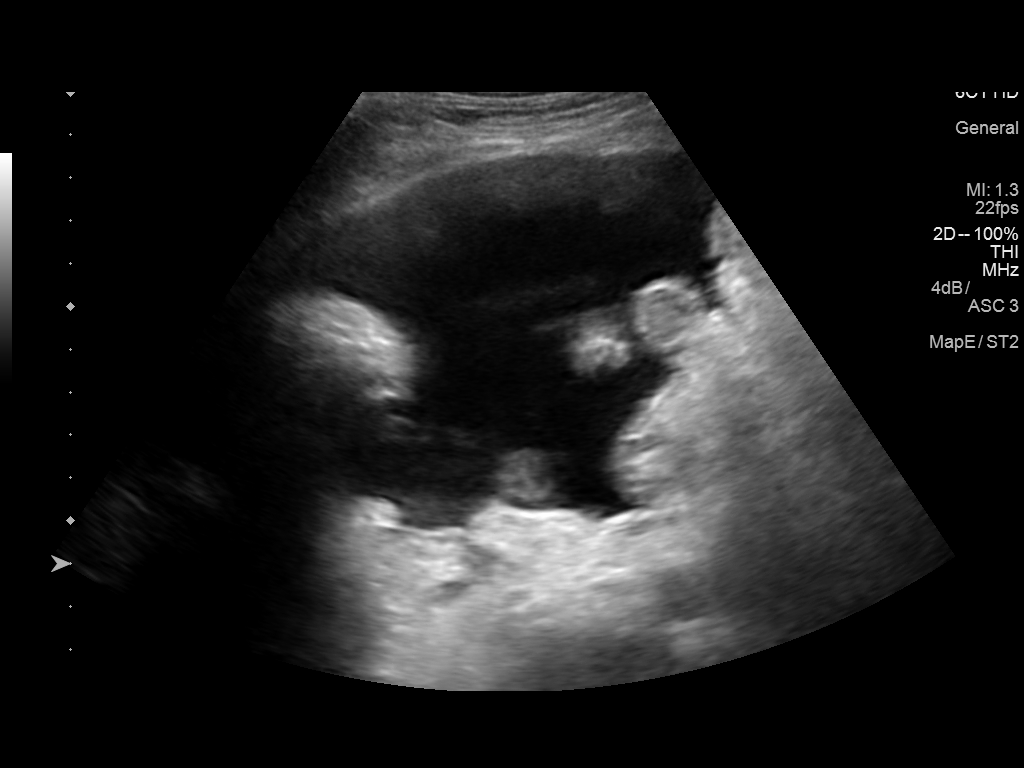
[im 2/6]
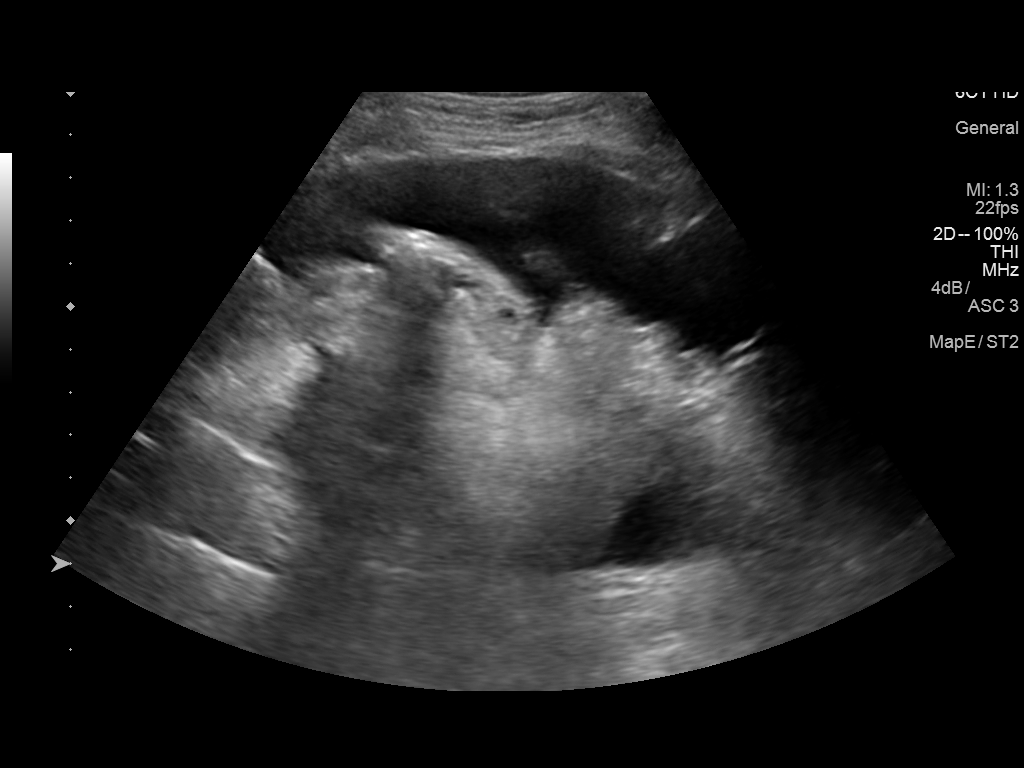
[im 3/6]
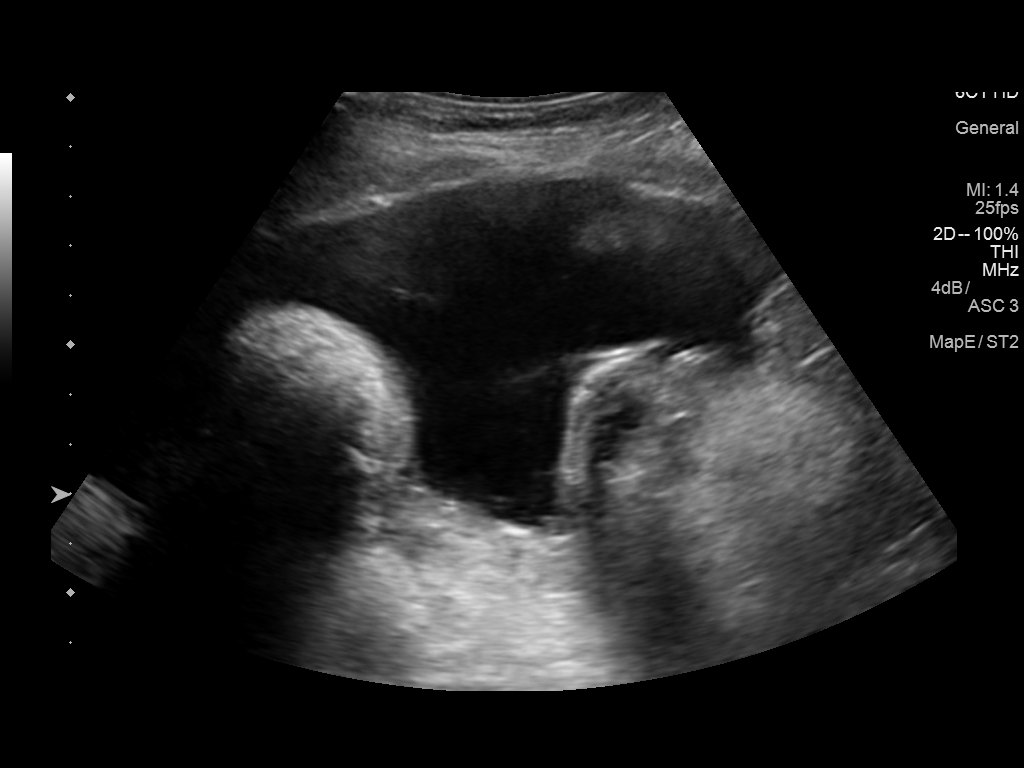
[im 4/6]
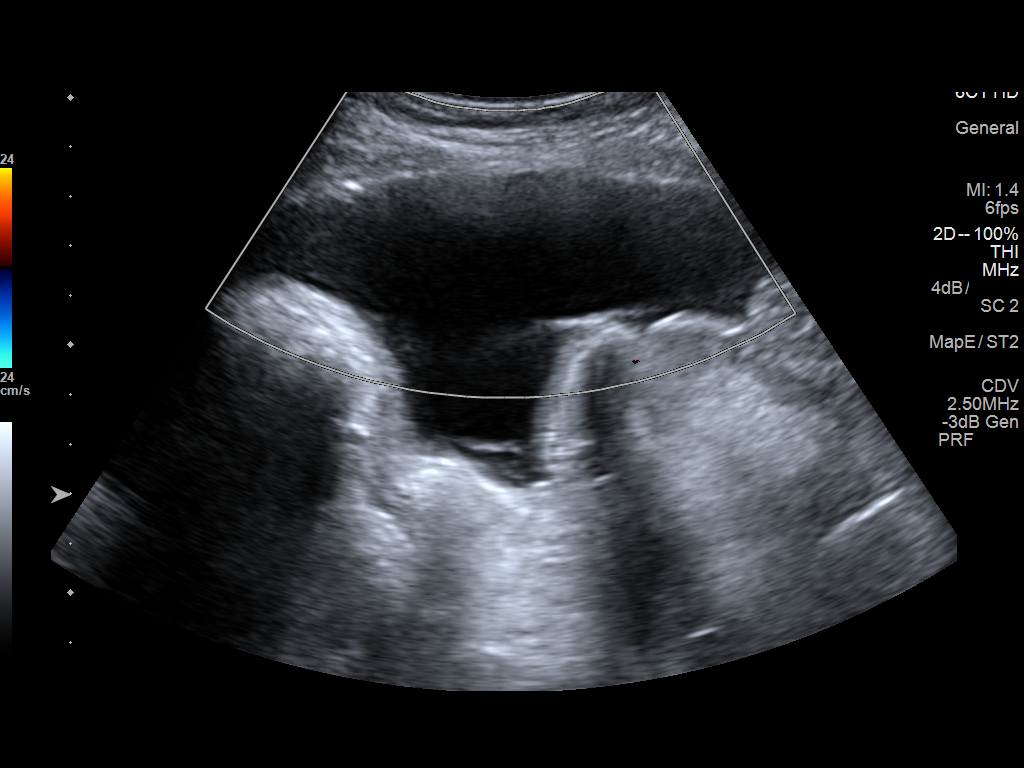
[im 5/6]
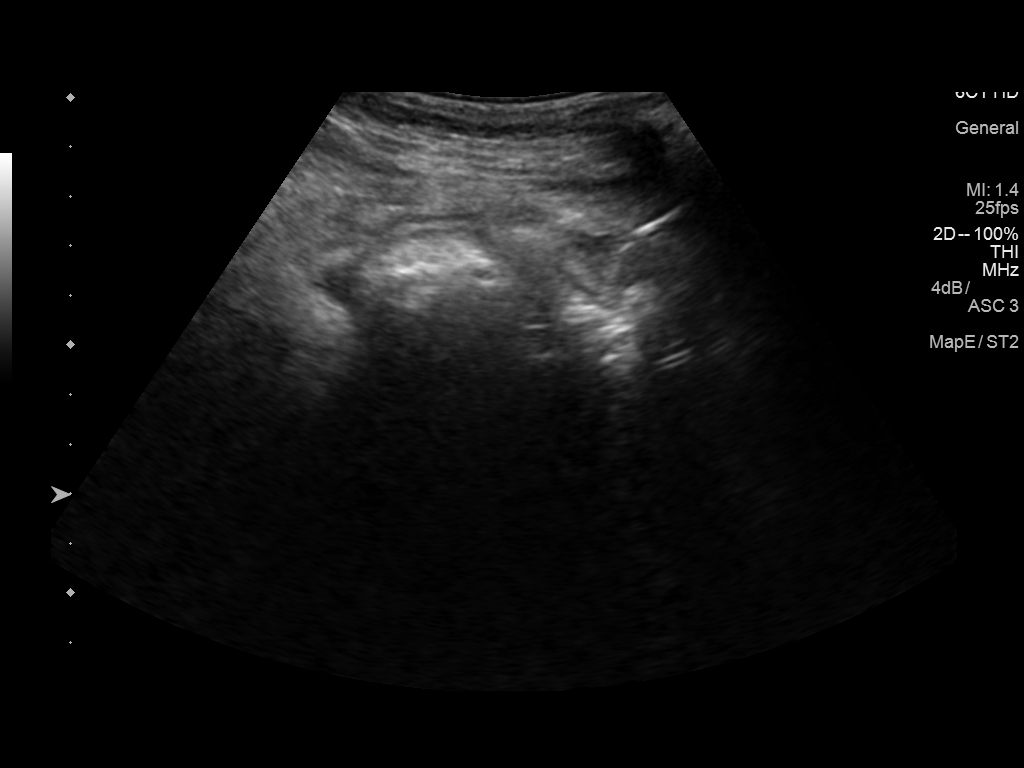
[im 6/6]
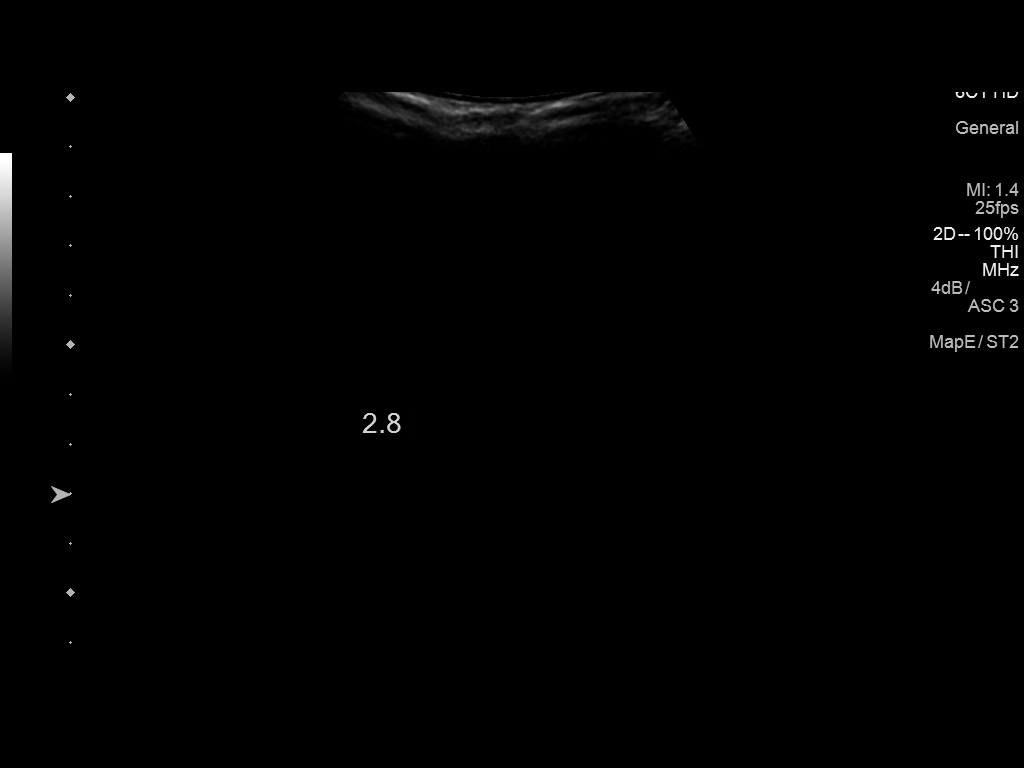

[6 of 6 positions shown; findings below may reference images not displayed]

EXAM:
ULTRASOUND GUIDED DIAGNOSTIC AND THERAPEUTIC PARACENTESIS

MEDICATIONS:
None.

COMPLICATIONS:
None immediate.

PROCEDURE:
Informed written consent was obtained from the patient after a
discussion of the risks, benefits and alternatives to treatment. A
timeout was performed prior to the initiation of the procedure.

Initial ultrasound scanning demonstrates a small to moderate amount
of ascites within the right lower abdominal quadrant. The right
lower abdomen was prepped and draped in the usual sterile fashion.
1% lidocaine was used for local anesthesia.

Following this, a Yueh catheter was introduced. An ultrasound image
was saved for documentation purposes. The paracentesis was
performed. The catheter was removed and a dressing was applied. The
patient tolerated the procedure well without immediate post
procedural complication.
FINDINGS: A total of approximately 2.8 liters of yellow fluid was removed.
Samples were sent to the laboratory as requested by the clinical
team.
IMPRESSION: Successful ultrasound-guided diagnostic and therapeutic paracentesis
yielding 2.8 liters of peritoneal fluid.
